# Patient Record
Sex: Female | Born: 1935 | Race: White | Hispanic: No | Smoking: Never smoker
Health system: Southern US, Community
[De-identification: ages and names within clinical notes are randomized; demographics above are authoritative.]

## PROBLEM LIST (undated history)

## (undated) DIAGNOSIS — I071 Rheumatic tricuspid insufficiency: Secondary | ICD-10-CM

## (undated) DIAGNOSIS — J45909 Unspecified asthma, uncomplicated: Secondary | ICD-10-CM

## (undated) DIAGNOSIS — K219 Gastro-esophageal reflux disease without esophagitis: Secondary | ICD-10-CM

## (undated) DIAGNOSIS — I272 Pulmonary hypertension, unspecified: Secondary | ICD-10-CM

## (undated) DIAGNOSIS — I4821 Permanent atrial fibrillation: Secondary | ICD-10-CM

## (undated) DIAGNOSIS — Z8711 Personal history of peptic ulcer disease: Secondary | ICD-10-CM

## (undated) DIAGNOSIS — Z9289 Personal history of other medical treatment: Secondary | ICD-10-CM

## (undated) DIAGNOSIS — Z5189 Encounter for other specified aftercare: Secondary | ICD-10-CM

## (undated) DIAGNOSIS — M3500 Sicca syndrome, unspecified: Secondary | ICD-10-CM

## (undated) DIAGNOSIS — T7840XA Allergy, unspecified, initial encounter: Secondary | ICD-10-CM

## (undated) DIAGNOSIS — M1990 Unspecified osteoarthritis, unspecified site: Secondary | ICD-10-CM

## (undated) DIAGNOSIS — F329 Major depressive disorder, single episode, unspecified: Secondary | ICD-10-CM

## (undated) DIAGNOSIS — H409 Unspecified glaucoma: Secondary | ICD-10-CM

## (undated) DIAGNOSIS — F32A Depression, unspecified: Secondary | ICD-10-CM

## (undated) DIAGNOSIS — Z8719 Personal history of other diseases of the digestive system: Secondary | ICD-10-CM

## (undated) DIAGNOSIS — I10 Essential (primary) hypertension: Secondary | ICD-10-CM

## (undated) HISTORY — DX: Personal history of other medical treatment: Z92.89

## (undated) HISTORY — DX: Major depressive disorder, single episode, unspecified: F32.9

## (undated) HISTORY — DX: Unspecified glaucoma: H40.9

## (undated) HISTORY — PX: TONSILLECTOMY: SUR1361

## (undated) HISTORY — DX: Pulmonary hypertension, unspecified: I27.20

## (undated) HISTORY — DX: Essential (primary) hypertension: I10

## (undated) HISTORY — DX: Personal history of peptic ulcer disease: Z87.11

## (undated) HISTORY — DX: Rheumatic tricuspid insufficiency: I07.1

## (undated) HISTORY — PX: ABDOMINAL HYSTERECTOMY: SHX81

## (undated) HISTORY — DX: Depression, unspecified: F32.A

## (undated) HISTORY — DX: Personal history of other diseases of the digestive system: Z87.19

## (undated) HISTORY — DX: Encounter for other specified aftercare: Z51.89

## (undated) HISTORY — PX: FOREARM SURGERY: SHX651

## (undated) HISTORY — PX: WRIST SURGERY: SHX841

## (undated) HISTORY — DX: Allergy, unspecified, initial encounter: T78.40XA

## (undated) HISTORY — DX: Permanent atrial fibrillation: I48.21

---

## 1998-11-12 LAB — HM DEXA SCAN

## 1999-06-20 LAB — HM COLONOSCOPY

## 2010-03-18 ENCOUNTER — Ambulatory Visit: Payer: Self-pay

## 2011-07-11 ENCOUNTER — Ambulatory Visit: Payer: Self-pay | Admitting: Unknown Physician Specialty

## 2012-06-06 ENCOUNTER — Emergency Department: Payer: Self-pay | Admitting: Emergency Medicine

## 2012-06-06 LAB — URINALYSIS, COMPLETE
Blood: NEGATIVE
Nitrite: NEGATIVE
Ph: 6 (ref 4.5–8.0)
Protein: NEGATIVE
RBC,UR: 1 /HPF (ref 0–5)
Specific Gravity: 1.015 (ref 1.003–1.030)
WBC UR: 2 /HPF (ref 0–5)

## 2012-06-06 LAB — COMPREHENSIVE METABOLIC PANEL
Albumin: 3.3 g/dL — ABNORMAL LOW (ref 3.4–5.0)
Alkaline Phosphatase: 98 U/L (ref 50–136)
BUN: 14 mg/dL (ref 7–18)
Calcium, Total: 9.8 mg/dL (ref 8.5–10.1)
Creatinine: 0.97 mg/dL (ref 0.60–1.30)
EGFR (African American): >60
EGFR (Non-African Amer.): 55 — ABNORMAL LOW
Osmolality: 283 (ref 275–301)
SGOT(AST): 25 U/L (ref 15–37)
SGPT (ALT): 15 U/L (ref 12–78)
Total Protein: 7.1 g/dL (ref 6.4–8.2)

## 2012-06-06 LAB — CBC
HCT: 33.4 % — ABNORMAL LOW (ref 35.0–47.0)
HGB: 10.9 g/dL — ABNORMAL LOW (ref 12.0–16.0)
MCH: 26.8 pg (ref 26.0–34.0)
Platelet: 404 10*3/uL (ref 150–440)
RBC: 4.06 10*6/uL (ref 3.80–5.20)
RDW: 15.8 % — ABNORMAL HIGH (ref 11.5–14.5)

## 2015-03-14 DIAGNOSIS — I481 Persistent atrial fibrillation: Secondary | ICD-10-CM | POA: Diagnosis not present

## 2015-03-21 DIAGNOSIS — M542 Cervicalgia: Secondary | ICD-10-CM | POA: Diagnosis not present

## 2015-03-21 DIAGNOSIS — Z5181 Encounter for therapeutic drug level monitoring: Secondary | ICD-10-CM | POA: Diagnosis not present

## 2015-03-29 ENCOUNTER — Emergency Department: Payer: Medicare Other

## 2015-03-29 ENCOUNTER — Observation Stay
Admission: EM | Admit: 2015-03-29 | Discharge: 2015-03-30 | Disposition: A | Discharge status: Home / Self Care | Payer: Medicare Other | Attending: Internal Medicine | Admitting: Internal Medicine

## 2015-03-29 ENCOUNTER — Encounter: Payer: Self-pay | Admitting: Emergency Medicine

## 2015-03-29 DIAGNOSIS — I482 Chronic atrial fibrillation: Secondary | ICD-10-CM | POA: Insufficient documentation

## 2015-03-29 DIAGNOSIS — R0602 Shortness of breath: Secondary | ICD-10-CM

## 2015-03-29 DIAGNOSIS — K219 Gastro-esophageal reflux disease without esophagitis: Secondary | ICD-10-CM | POA: Insufficient documentation

## 2015-03-29 DIAGNOSIS — I4891 Unspecified atrial fibrillation: Secondary | ICD-10-CM | POA: Diagnosis present

## 2015-03-29 DIAGNOSIS — J45909 Unspecified asthma, uncomplicated: Secondary | ICD-10-CM | POA: Insufficient documentation

## 2015-03-29 DIAGNOSIS — M1991 Primary osteoarthritis, unspecified site: Secondary | ICD-10-CM | POA: Diagnosis not present

## 2015-03-29 DIAGNOSIS — Z885 Allergy status to narcotic agent status: Secondary | ICD-10-CM | POA: Insufficient documentation

## 2015-03-29 DIAGNOSIS — M47812 Spondylosis without myelopathy or radiculopathy, cervical region: Secondary | ICD-10-CM | POA: Diagnosis not present

## 2015-03-29 DIAGNOSIS — K1121 Acute sialoadenitis: Secondary | ICD-10-CM | POA: Diagnosis not present

## 2015-03-29 DIAGNOSIS — I10 Essential (primary) hypertension: Secondary | ICD-10-CM | POA: Insufficient documentation

## 2015-03-29 DIAGNOSIS — R220 Localized swelling, mass and lump, head: Secondary | ICD-10-CM | POA: Diagnosis not present

## 2015-03-29 DIAGNOSIS — Z7901 Long term (current) use of anticoagulants: Secondary | ICD-10-CM | POA: Diagnosis not present

## 2015-03-29 DIAGNOSIS — Z90710 Acquired absence of both cervix and uterus: Secondary | ICD-10-CM | POA: Insufficient documentation

## 2015-03-29 DIAGNOSIS — Z9889 Other specified postprocedural states: Secondary | ICD-10-CM | POA: Insufficient documentation

## 2015-03-29 DIAGNOSIS — K1120 Sialoadenitis, unspecified: Secondary | ICD-10-CM | POA: Diagnosis not present

## 2015-03-29 DIAGNOSIS — G319 Degenerative disease of nervous system, unspecified: Secondary | ICD-10-CM | POA: Insufficient documentation

## 2015-03-29 DIAGNOSIS — Z7951 Long term (current) use of inhaled steroids: Secondary | ICD-10-CM | POA: Insufficient documentation

## 2015-03-29 DIAGNOSIS — Z880 Allergy status to penicillin: Secondary | ICD-10-CM | POA: Insufficient documentation

## 2015-03-29 DIAGNOSIS — M3500 Sicca syndrome, unspecified: Secondary | ICD-10-CM | POA: Insufficient documentation

## 2015-03-29 HISTORY — DX: Unspecified asthma, uncomplicated: J45.909

## 2015-03-29 HISTORY — DX: Unspecified osteoarthritis, unspecified site: M19.90

## 2015-03-29 HISTORY — DX: Sjogren syndrome, unspecified: M35.00

## 2015-03-29 HISTORY — DX: Gastro-esophageal reflux disease without esophagitis: K21.9

## 2015-03-29 LAB — CBC
HEMATOCRIT: 41.8 % (ref 35.0–47.0)
Hemoglobin: 13.5 g/dL (ref 12.0–16.0)
MCH: 31.2 pg (ref 26.0–34.0)
MCHC: 32.2 g/dL (ref 32.0–36.0)
MCV: 96.8 fL (ref 80.0–100.0)
Platelets: 185 10*3/uL (ref 150–440)
RBC: 4.32 MIL/uL (ref 3.80–5.20)
RDW: 13.5 % (ref 11.5–14.5)
WBC: 7.6 10*3/uL (ref 3.6–11.0)

## 2015-03-29 LAB — BASIC METABOLIC PANEL
Anion gap: 6 (ref 5–15)
BUN: 15 mg/dL (ref 6–20)
CHLORIDE: 106 mmol/L (ref 101–111)
CO2: 22 mmol/L (ref 22–32)
Calcium: 9.8 mg/dL (ref 8.9–10.3)
Creatinine, Ser: 0.77 mg/dL (ref 0.44–1.00)
GFR calc Af Amer: >60 mL/min (ref >60)
GFR calc non Af Amer: >60 mL/min (ref >60)
GLUCOSE: 93 mg/dL (ref 65–99)
POTASSIUM: 4.9 mmol/L (ref 3.5–5.1)
Sodium: 134 mmol/L — ABNORMAL LOW (ref 135–145)

## 2015-03-29 LAB — TROPONIN I: TROPONIN I: 0.04 ng/mL — AB (ref <0.031)

## 2015-03-29 LAB — PROTIME-INR
INR: 1.21
Prothrombin Time: 15.5 seconds — ABNORMAL HIGH (ref 11.4–15.0)

## 2015-03-29 MED ORDER — PREDNISONE 50 MG PO TABS
60.0000 mg | ORAL_TABLET | Freq: Every day | ORAL | Status: DC
  Administered 2015-03-30: 60 mg via ORAL
  Filled 2015-03-29: qty 1

## 2015-03-29 MED ORDER — HYDROCODONE-ACETAMINOPHEN 7.5-325 MG PO TABS
ORAL_TABLET | ORAL | Status: AC
Start: 2015-03-29 — End: 2015-03-30
  Filled 2015-03-29: qty 1

## 2015-03-29 MED ORDER — ESCITALOPRAM OXALATE 10 MG PO TABS
40.0000 mg | ORAL_TABLET | Freq: Every day | ORAL | Status: DC
  Administered 2015-03-30: 40 mg via ORAL
  Filled 2015-03-29: qty 4

## 2015-03-29 MED ORDER — PREDNISONE 20 MG PO TABS
60.0000 mg | ORAL_TABLET | Freq: Once | ORAL | Status: AC
  Administered 2015-03-29: 60 mg via ORAL
  Filled 2015-03-29: qty 3

## 2015-03-29 MED ORDER — APIXABAN 2.5 MG PO TABS
2.5000 mg | ORAL_TABLET | Freq: Two times a day (BID) | ORAL | Status: DC
  Administered 2015-03-30: 2.5 mg via ORAL
  Filled 2015-03-29: qty 1

## 2015-03-29 MED ORDER — BRIMONIDINE TARTRATE 0.15 % OP SOLN
1.0000 [drp] | Freq: Two times a day (BID) | OPHTHALMIC | Status: DC
  Administered 2015-03-30 (×2): 1 [drp] via OPHTHALMIC
  Filled 2015-03-29: qty 5

## 2015-03-29 MED ORDER — SODIUM CHLORIDE 0.9 % IV SOLN
INTRAVENOUS | Status: DC
  Administered 2015-03-29: via INTRAVENOUS

## 2015-03-29 MED ORDER — SODIUM CHLORIDE 0.9% FLUSH
3.0000 mL | Freq: Two times a day (BID) | INTRAVENOUS | Status: DC
  Administered 2015-03-30: 3 mL via INTRAVENOUS

## 2015-03-29 MED ORDER — ACETAMINOPHEN 325 MG PO TABS: 650.0000 mg | ORAL_TABLET | Freq: Four times a day (QID) | ORAL | Status: DC | PRN

## 2015-03-29 MED ORDER — METOPROLOL TARTRATE 25 MG PO TABS
12.5000 mg | ORAL_TABLET | Freq: Once | ORAL | Status: AC
  Administered 2015-03-29: 12.5 mg via ORAL
  Filled 2015-03-29: qty 1

## 2015-03-29 MED ORDER — ASPIRIN 81 MG PO CHEW
324.0000 mg | CHEWABLE_TABLET | Freq: Once | ORAL | Status: AC
  Administered 2015-03-29: 324 mg via ORAL
  Filled 2015-03-29: qty 4

## 2015-03-29 MED ORDER — PANTOPRAZOLE SODIUM 40 MG PO TBEC
40.0000 mg | DELAYED_RELEASE_TABLET | Freq: Every day | ORAL | Status: DC
  Administered 2015-03-30: 40 mg via ORAL
  Filled 2015-03-29: qty 1

## 2015-03-29 MED ORDER — AMLODIPINE BESYLATE 5 MG PO TABS
5.0000 mg | ORAL_TABLET | Freq: Every day | ORAL | Status: DC
  Administered 2015-03-30: 5 mg via ORAL
  Filled 2015-03-29: qty 1

## 2015-03-29 MED ORDER — MELATONIN 3 MG PO TABS: 3.0000 mg | ORAL_TABLET | Freq: Every day | ORAL | Status: DC

## 2015-03-29 MED ORDER — CLINDAMYCIN PHOSPHATE 600 MG/50ML IV SOLN
600.0000 mg | Freq: Four times a day (QID) | INTRAVENOUS | Status: DC
  Administered 2015-03-30: 600 mg via INTRAVENOUS
  Filled 2015-03-29 (×4): qty 50

## 2015-03-29 MED ORDER — METOPROLOL TARTRATE 50 MG PO TABS
50.0000 mg | ORAL_TABLET | Freq: Two times a day (BID) | ORAL | Status: DC
  Administered 2015-03-30: 50 mg via ORAL
  Filled 2015-03-29: qty 1

## 2015-03-29 MED ORDER — HYDROCODONE-ACETAMINOPHEN 7.5-325 MG PO TABS
1.0000 | ORAL_TABLET | Freq: Three times a day (TID) | ORAL | Status: DC | PRN
  Administered 2015-03-29: 1 via ORAL

## 2015-03-29 MED ORDER — ONDANSETRON HCL 4 MG/2ML IJ SOLN
4.0000 mg | Freq: Four times a day (QID) | INTRAMUSCULAR | Status: DC | PRN
Start: 2015-03-29 — End: 2015-03-30

## 2015-03-29 MED ORDER — CLINDAMYCIN PHOSPHATE 600 MG/50ML IV SOLN
600.0000 mg | Freq: Once | INTRAVENOUS | Status: AC
  Administered 2015-03-29: 600 mg via INTRAVENOUS
  Filled 2015-03-29: qty 50

## 2015-03-29 MED ORDER — ACETAMINOPHEN 650 MG RE SUPP: 650.0000 mg | Freq: Four times a day (QID) | RECTAL | Status: DC | PRN

## 2015-03-29 MED ORDER — BUDESONIDE-FORMOTEROL FUMARATE 160-4.5 MCG/ACT IN AERO
2.0000 | INHALATION_SPRAY | Freq: Two times a day (BID) | RESPIRATORY_TRACT | Status: DC
  Administered 2015-03-30: 2 via RESPIRATORY_TRACT
  Filled 2015-03-29: qty 6

## 2015-03-29 MED ORDER — ONDANSETRON HCL 4 MG PO TABS: 4.0000 mg | ORAL_TABLET | Freq: Four times a day (QID) | ORAL | Status: DC | PRN

## 2015-03-29 NOTE — ED Notes (Signed)
Pt from subwait to room 9. Pt went to urgent care with c/o swollen glands in her neck. While there c/o sob so they took an ekg and it showed her afib. Pt dx with afib and is on eliquis. Labs and iv previously obtained in triage. Pt on monitor

## 2015-03-29 NOTE — ED Notes (Signed)
Dr. Gayle at bedside  

## 2015-03-29 NOTE — H&P (Signed)
[facility] [place] NAME: Peggy Carter    MR#:  [id number]  DATE OF BIRTH:  1929/09/12   DATE OF ADMISSION:  03/29/2015  PRIMARY CARE PHYSICIAN: Morton Peters, MD   REQUESTING/REFERRING PHYSICIAN: Edd Fabian  CHIEF COMPLAINT:   Chief Complaint  Patient presents with  . Lymphadenopathy  . Shortness of Breath    HISTORY OF PRESENT ILLNESS:  Peggy Carter  is a 82 y.o. female with a known history of Sjogren's, atrial fib on anticoagulation who is presenting with left facial swelling. She states approximate 2 day duration of left facial swelling. Has associated pain with touch, pressure described only as "pain" intensity 5/10 worse with touching no relieving factors nonradiating. This has not affected her appetite, ability to swallow. On arrival to emergency department she complained shortness of breath noted to be in atrial fibrillation rapid ventricular response which has improved. Case discussed with ENT about parotiditis findings who recommended steroids and antibiotics  PAST MEDICAL HISTORY:   Past Medical History  Diagnosis Date  . A-fib ([place])   . Arthritis   . Asthma   . Sjoegren syndrome ([place])   . Acid reflux     PAST SURGICAL HISTORY:   Past Surgical History  Procedure Laterality Date  . Arm surgery    . Abdominal hysterectomy      SOCIAL HISTORY:   Social History  Substance Use Topics  . Smoking status: Never Smoker   . Smokeless tobacco: Not on file  . Alcohol Use: No    FAMILY HISTORY:   Family History  Problem Relation Age of Onset  . Diabetes Neg Hx     DRUG ALLERGIES:   Allergies  Allergen Reactions  . Penicillins Anaphylaxis and Hives    States "almost died" Has patient had a PCN reaction causing immediate rash, facial/tongue/throat swelling, SOB or lightheadedness with hypotension: Yes Has patient had a PCN reaction causing severe rash involving mucus membranes or skin  necrosis: No Has patient had a PCN reaction that required hospitalization No Has patient had a PCN reaction occurring within the last 10 years: No If all of the above answers are "NO", then may proceed with Cephalosporin use.  . Codeine Hives    REVIEW OF SYSTEMS:  REVIEW OF SYSTEMS:  CONSTITUTIONAL: Denies fevers, chills, fatigue, weakness.  EYES: Denies blurred vision, double vision, or eye pain.  EARS, NOSE, THROAT: Denies tinnitus, ear pain, hearing loss.  RESPIRATORY: denies cough, shortness of breath, wheezing  CARDIOVASCULAR: Denies chest pain, palpitations, edema.  GASTROINTESTINAL: Denies nausea, vomiting, diarrhea, abdominal pain.  GENITOURINARY: Denies dysuria, hematuria.  ENDOCRINE: Denies nocturia or thyroid problems. HEMATOLOGIC AND LYMPHATIC: Denies easy bruising or bleeding.  SKIN: Denies rash or lesions.  MUSCULOSKELETAL: Denies pain in neck, back, shoulder, knees, hips, or further arthritic symptoms.  NEUROLOGIC: Denies paralysis, paresthesias.  PSYCHIATRIC: Denies anxiety or depressive symptoms. Otherwise full review of systems performed by me is negative.   MEDICATIONS AT HOME:   Prior to Admission medications   Medication Sig Start Date End Date Taking? Authorizing Provider  amLODipine (NORVASC) 5 MG tablet Take 5 mg by mouth daily.   Yes Historical Provider, MD  apixaban (ELIQUIS) 2.5 MG TABS tablet Take 2.5 mg by mouth 2 (two) times daily.   Yes Historical Provider, MD  brimonidine (ALPHAGAN P) 0.1 % SOLN Apply 1 drop to eye 2 (two) times daily.   Yes Historical Provider, MD  budesonide-formoterol (SYMBICORT) 160-4.5 MCG/ACT inhaler Inhale  2 puffs into the lungs 2 (two) times daily.   Yes Historical Provider, MD  escitalopram (LEXAPRO) 20 MG tablet Take 40 mg by mouth daily.   Yes Historical Provider, MD  HYDROcodone-acetaminophen (NORCO) 7.5-325 MG tablet Take 1 tablet by mouth 3 (three) times daily as needed for moderate pain.   Yes Historical Provider, MD   Melatonin 3 MG TABS Take 3 mg by mouth at bedtime.   Yes Historical Provider, MD  metoprolol (LOPRESSOR) 50 MG tablet Take 50 mg by mouth 2 (two) times daily.   Yes Historical Provider, MD  omeprazole (PRILOSEC) 20 MG capsule Take 20 mg by mouth 2 (two) times daily before a meal.   Yes Historical Provider, MD      VITAL SIGNS:  Blood pressure 125/72, pulse 100, temperature 97.9 F (36.6 C), temperature source Oral, resp. rate 17, height 4\' 11"  (1.499 m), weight 106 lb (48.081 kg), SpO2 95 %.  PHYSICAL EXAMINATION:  VITAL SIGNS: Filed Vitals:   03/29/15 2214 03/29/15 2230  BP: 131/109 125/72  Pulse: 112 100  Temp:    Resp:  22   GENERAL:82 y.o.female currently in no acute distress.  HEAD: Normocephalic, atraumatic. Marked swelling angle of left mandible without erythema EYES: Pupils equal, round, reactive to light. Extraocular muscles intact. No scleral icterus.  MOUTH: Moist mucosal membrane. Dentition intact. No abscess noted.  EAR, NOSE, THROAT: Clear without exudates. No external lesions.  NECK: Supple. No thyromegaly. No nodules. No JVD.  PULMONARY: Clear to ascultation, without wheeze rails or rhonci. No use of accessory muscles, Good respiratory effort. good air entry bilaterally CHEST: Nontender to palpation.  CARDIOVASCULAR: S1 and S2. irRegular rate and rhythm. No murmurs, rubs, or gallops. No edema. Pedal pulses 2+ bilaterally.  GASTROINTESTINAL: Soft, nontender, nondistended. No masses. Positive bowel sounds. No hepatosplenomegaly.  MUSCULOSKELETAL: No swelling, clubbing, or edema. Range of motion full in all extremities.  NEUROLOGIC: Cranial nerves II through XII are intact. No gross focal neurological deficits. Sensation intact. Reflexes intact.  SKIN: No ulceration, lesions, rashes, or cyanosis. Skin warm and dry. Turgor intact.  PSYCHIATRIC: Mood, affect within normal limits. The patient is awake, alert and oriented x 3. Insight, judgment intact.    LABORATORY  PANEL:   CBC  Recent Labs Lab 03/29/15 1844  WBC 7.6  HGB 13.5  HCT 41.8  PLT 185   ------------------------------------------------------------------------------------------------------------------  Chemistries   Recent Labs Lab 03/29/15 1844  NA 134*  K 4.9  CL 106  CO2 22  GLUCOSE 93  BUN 15  CREATININE 0.77  CALCIUM 9.8   ------------------------------------------------------------------------------------------------------------------  Cardiac Enzymes  Recent Labs Lab 03/29/15 1844  TROPONINI 0.04*   ------------------------------------------------------------------------------------------------------------------  RADIOLOGY:  Dg Chest 2 View  03/29/2015  CLINICAL DATA:  Shortness of breath today.  Initial encounter. EXAM: CHEST  2 VIEW COMPARISON:  Single view of the chest 06/06/2012. PA and lateral chest 03/18/2010. FINDINGS: There is mild cardiomegaly without edema. No consolidative process, pneumothorax or effusion. No focal bony abnormality. IMPRESSION: No acute disease. Electronically Signed   By: Inge Rise M.D.   On: 03/29/2015 19:11   Ct Maxillofacial Wo Cm  03/29/2015  CLINICAL DATA:  Pre articular swelling bilaterally for 3 days, worse on the left. Initial encounter. EXAM: CT MAXILLOFACIAL WITHOUT CONTRAST TECHNIQUE: Multidetector CT imaging of the maxillofacial structures was performed. Multiplanar CT image reconstructions were also generated. A small metallic BB was placed on the right temple in order to reliably differentiate right from left. COMPARISON:  None. FINDINGS: A  marker is placed in the region of concern and overlies the left parotid gland. No underlying fluid collection or mass identified. The left parotid gland is more prominent than the right. No ductal dilatation or stone is identified. The submandibular glands are atrophic. No lymphadenopathy or mass is seen. The patient is status post bilateral lens extraction. Imaged intracranial  contents demonstrate cortical atrophy. Imaged paranasal sinuses are clear. The patient has marked multilevel cervical spondylosis. No lytic or sclerotic bony lesion is identified. IMPRESSION: Although both parotid glands demonstrate fatty atrophy, the left parotid is more prominent than the right possibly due to infectious or inflammatory process (parotiditis). No stone or mass is identified. Cervical spondylosis. Electronically Signed   By: Inge Rise M.D.   On: 03/29/2015 22:18    EKG:   Orders placed or performed during the hospital encounter of 03/29/15  . EKG 12-Lead  . EKG 12-Lead  . ED EKG within 10 minutes  . ED EKG within 10 minutes    IMPRESSION AND PLAN:   82 year old Caucasian female history of Sjogren's presenting with facial swelling  1. Parotiditis: Case discussed with ENT in emergency department, antibiotics, steroids 2. Atrial fibrillation rapid ventricular response: Has improved, continue IV fluid hydration, Cardizem as required, heart rate less than 120 continue anticoagulation, trend troponins 3. Hypertension essential: Norvasc , metoprolol 4. GERD without esophagitis: PPI therapy 5. Venous thrombus embolism prophylactic: Therapeutic anticoagulation    All the records are reviewed and case discussed with ED provider. Management plans discussed with the patient, family and they are in agreement.  CODE STATUS: Full  TOTAL TIME TAKING CARE OF THIS PATIENT: 35 minutes.    Kento Gossman,  Karenann Cai.D on 03/29/2015 at 11:48 PM  Between 7am to 6pm - Pager - [phone]  After 6pm: House Pager: - [facility]  Office  [phone]  CC: Primary care physician; Morton Peters, MD

## 2015-03-29 NOTE — ED Notes (Signed)
Pt is on blood thinner, has a hx of afib.

## 2015-03-29 NOTE — ED Notes (Signed)
Pt states her glands on both sides of her neck became swollen 3 days ago. Son states she became sob on the way here, she used her respiratory inhaler, pt appears in no distress, talking and laughing.

## 2015-03-29 NOTE — ED Provider Notes (Signed)
[facility] Emergency Department Provider Note  ____________________________________________  Time seen: Approximately 8:10 PM  I have reviewed the triage vital signs and the nursing notes.   HISTORY  Chief Complaint Lymphadenopathy and Shortness of Breath    HPI Arianny Prisbrey is a 82 y.o. female with history of A. fib on elliquis, Sjogren syndrome, emphysema who presents for evaluation of shortness of breath and left-sided preauricular swelling, gradual onset today, intermittent, currently mild. The patient reports that she awoke with some swelling just in front of her left ear this morning. She reports that in the past she had a salivary gland infection that was treated with Cipro that presented similarly. She went to urgent care today and her EKG showed atrial fibrillation with an increased heart rate and because she was complaining of some mild shortness of breath, she was sent here for further evaluation. At this time though she reports that she has no shortness of breath. She has had no fevers. She has no chest pain. She has no abdominal pain, no vomiting, no diarrhea.   Past Medical History  Diagnosis Date  . A-fib ([place])   . Arthritis   . Asthma   . Sjoegren syndrome ([place])   . Acid reflux     There are no active problems to display for this patient.   Past Surgical History  Procedure Laterality Date  . Arm surgery    . Abdominal hysterectomy      Current Outpatient Rx  Name  Route  Sig  Dispense  Refill  . amLODipine (NORVASC) 5 MG tablet   Oral   Take 5 mg by mouth daily.         Marland Kitchen apixaban (ELIQUIS) 2.5 MG TABS tablet   Oral   Take 2.5 mg by mouth 2 (two) times daily.         . brimonidine (ALPHAGAN P) 0.1 % SOLN   Ophthalmic   Apply 1 drop to eye 2 (two) times daily.         . budesonide-formoterol (SYMBICORT) 160-4.5 MCG/ACT inhaler   Inhalation   Inhale 2 puffs into the lungs 2 (two) times daily.         Marland Kitchen  escitalopram (LEXAPRO) 20 MG tablet   Oral   Take 40 mg by mouth daily.         Marland Kitchen HYDROcodone-acetaminophen (NORCO) 7.5-325 MG tablet   Oral   Take 1 tablet by mouth 3 (three) times daily as needed for moderate pain.         . Melatonin 3 MG TABS   Oral   Take 3 mg by mouth at bedtime.         . metoprolol (LOPRESSOR) 50 MG tablet   Oral   Take 50 mg by mouth 2 (two) times daily.         Marland Kitchen omeprazole (PRILOSEC) 20 MG capsule   Oral   Take 20 mg by mouth 2 (two) times daily before a meal.           Allergies Penicillins and Codeine  No family history on file.  Social History Social History  Substance Use Topics  . Smoking status: Never Smoker   . Smokeless tobacco: None  . Alcohol Use: No    Review of Systems Constitutional: No fever/chills Eyes: No visual changes. ENT: No sore throat. Cardiovascular: Denies chest pain. Respiratory: +shortness of breath. Gastrointestinal: No abdominal pain.  No nausea, no vomiting.  No diarrhea.  No constipation. Genitourinary:  Negative for dysuria. Musculoskeletal: Negative for back pain. Skin: Negative for rash. Neurological: Negative for headaches, focal weakness or numbness.  10-point ROS otherwise negative.  ____________________________________________   PHYSICAL EXAM:  VITAL SIGNS: ED Triage Vitals  Enc Vitals Group     BP 03/29/15 1832 113/77 mmHg     Pulse Rate 03/29/15 1832 89     Resp 03/29/15 1832 18     Temp 03/29/15 1832 97.9 F (36.6 C)     Temp Source 03/29/15 1832 Oral     SpO2 03/29/15 1832 97 %     Weight 03/29/15 1832 106 lb (48.081 kg)     Height 03/29/15 1832 4\' 11"  (1.499 m)     Head Cir --      Peak Flow --      Pain Score 03/29/15 1835 8     Pain Loc --      Pain Edu? --      Excl. in [place]? --     Constitutional: Alert and oriented. Well appearing and in no acute distress. Eyes: Conjunctivae are normal. PERRL. EOMI. Head: Atraumatic. 1 cm nontender preauricular swelling on the  left without surrounding erythema, no fluctuance. Nose: No congestion/rhinnorhea. Mouth/Throat: Mucous membranes are moist.  Oropharynx non-erythematous. Neck: No stridor.   Cardiovascular: tachycardic rate, irregular rhythm. Grossly normal heart sounds.  Good peripheral circulation. Respiratory: Normal respiratory effort.  No retractions. Lungs CTAB. Gastrointestinal: Soft and nontender. No distention. No CVA tenderness. Genitourinary: deferred Musculoskeletal: No lower extremity tenderness nor edema.  No joint effusions. Neurologic:  Normal speech and language. No gross focal neurologic deficits are appreciated.  Skin:  Skin is warm, dry and intact. No rash noted. Psychiatric: Mood and affect are normal. Speech and behavior are normal.  ____________________________________________   LABS (all labs ordered are listed, but only abnormal results are displayed)  Labs Reviewed  BASIC METABOLIC PANEL - Abnormal; Notable for the following:    Sodium 134 (*)    All other components within normal limits  PROTIME-INR - Abnormal; Notable for the following:    Prothrombin Time 15.5 (*)    All other components within normal limits  TROPONIN I - Abnormal; Notable for the following:    Troponin I 0.04 (*)    All other components within normal limits  CULTURE, BLOOD (ROUTINE X 2)  CULTURE, BLOOD (ROUTINE X 2)  CBC   ____________________________________________  EKG  ED ECG REPORT I, Joanne Gavel, the attending physician, personally viewed and interpreted this ECG.   Date: 03/29/2015  EKG Time: 18:38  Rate: 105  Rhythm: atrial fibrillation, rate 105  Axis: normal  Intervals:none  ST&T Change: No acute ST elevation.  ____________________________________________  RADIOLOGY  CXR  IMPRESSION: No acute disease.  CT face IMPRESSION: Although both parotid glands demonstrate fatty atrophy, the left parotid is more prominent than the right possibly due to infectious or  inflammatory process (parotiditis). No stone or mass is identified.  Cervical spondylosis.  ____________________________________________   PROCEDURES  Procedure(s) performed: None  Critical Care performed: No  ____________________________________________   INITIAL IMPRESSION / ASSESSMENT AND PLAN / ED COURSE  Pertinent labs & imaging results that were available during my care of the patient were reviewed by me and considered in my medical decision making (see chart for details).  Lonny Widdowson is a 82 y.o. female with history of A. fib on elliquis, Sjogren syndrome, emphysema who presents for evaluation of shortness of breath and left-sided preauricular swelling, gradual onset today, intermittent, currently mild.  On exam, she is well-appearing and in no acute distress. She is tachycardic with a heart rate of 115 bpm. EKG shows atrial fibrillation. The remainder of her vital signs are stable and she is afebrile. Troponin is elevated at 0.04, suspect possibly demand ischemia related to atrial fibrillation with elevated rate though NSTEMI is on the differential given her shortness of breath today which could represent atypical ACS. Heart rate is 97 bpm at this time after small dose of by mouth metoprolol. CT scan shows possible parotiditis. I discussed the case with Dr. Pryor Ochoa of ENT who recommends steroids and antibiotics. We'll give IV clindamycin. Blood cultures ordered. Case discussed with the hospitalist, Dr. Lavetta Nielsen, for admission at 63 PM. ____________________________________________   FINAL CLINICAL IMPRESSION(S) / ED DIAGNOSES  Final diagnoses:  SOB (shortness of breath)  Atrial fibrillation with rapid ventricular response ([place])  Parotiditis      Joanne Gavel, MD 03/29/15 2304

## 2015-03-29 NOTE — ED Notes (Signed)
Dr. Hower at bedside.  

## 2015-03-30 DIAGNOSIS — I10 Essential (primary) hypertension: Secondary | ICD-10-CM | POA: Diagnosis not present

## 2015-03-30 DIAGNOSIS — K1121 Acute sialoadenitis: Secondary | ICD-10-CM | POA: Diagnosis not present

## 2015-03-30 DIAGNOSIS — K1120 Sialoadenitis, unspecified: Secondary | ICD-10-CM | POA: Diagnosis not present

## 2015-03-30 DIAGNOSIS — K219 Gastro-esophageal reflux disease without esophagitis: Secondary | ICD-10-CM | POA: Diagnosis not present

## 2015-03-30 DIAGNOSIS — I4891 Unspecified atrial fibrillation: Secondary | ICD-10-CM | POA: Diagnosis not present

## 2015-03-30 LAB — TROPONIN I
Troponin I: <0.03 ng/mL (ref <0.031)
Troponin I: <0.03 ng/mL (ref <0.031)

## 2015-03-30 LAB — TSH: TSH: 0.962 u[IU]/mL (ref 0.350–4.500)

## 2015-03-30 MED ORDER — CLINDAMYCIN HCL 300 MG PO CAPS: 300.0000 mg | ORAL_CAPSULE | Freq: Three times a day (TID) | ORAL | Status: DC

## 2015-03-30 MED ORDER — PREDNISONE 10 MG PO TABS: 60.0000 mg | ORAL_TABLET | Freq: Every day | ORAL | Status: DC

## 2015-03-30 MED ORDER — CLINDAMYCIN HCL 150 MG PO CAPS
300.0000 mg | ORAL_CAPSULE | Freq: Three times a day (TID) | ORAL | Status: DC
  Filled 2015-03-30 (×3): qty 1

## 2015-03-30 MED ORDER — PNEUMOCOCCAL VAC POLYVALENT 25 MCG/0.5ML IJ INJ: 0.5000 mL | INJECTION | INTRAMUSCULAR | Status: DC

## 2015-03-30 NOTE — Care Management Note (Signed)
Case Management Note  Patient Details  Name: Peggy Carter MRN: [id number] Date of Birth: June 04, 1929  Subjective/Objective:    Case discussed with Dr. Posey Pronto. Discharge planned for today. No needs identified. Case closed               Action/Plan:   Expected Discharge Date:                  Expected Discharge Plan:  Home/Self Care  In-House Referral:     Discharge planning Services  CM Consult  Post Acute Care Choice:    Choice offered to:     DME Arranged:    DME Agency:     HH Arranged:    HH Agency:     Status of Service:  In process, will continue to follow  Medicare Important Message Given:    Date Medicare IM Given:    Medicare IM give by:    Date Additional Medicare IM Given:    Additional Medicare Important Message give by:     If discussed at [place] of Stay Meetings, dates discussed:    Additional Comments:  Jolly Mango, RN 03/30/2015, 9:44 AM

## 2015-03-30 NOTE — Care Management Note (Signed)
Case Management Note  Patient Details  Name: Peggy Carter MRN: [id number] Date of Birth: September 15, 1929  Subjective/Objective:   RNCM assessment for discharge planning. Met with patient and she ask that I speak with her daughter in law, Nannette Vicencio. TC to daughter in law. Patient lives at home with her son and this daughter in law. She uses a cane and a walker. Get her medications at [facility] in [place]. Patients PCP was Dr. Hardin Negus and she was last seen in approximately May 2016. Nannette states that no one has been answering at the MD office so they are not sure if he has retired. She is going to get patient established with a new PCP at [facility]. Denies issues obtaining medications, copays  or transporation.                 Action/Plan: Following progression. No needs anticipated at this time  Expected Discharge Date:                  Expected Discharge Plan:     In-House Referral:     Discharge planning Services  CM Consult  Post Acute Care Choice:    Choice offered to:     DME Arranged:    DME Agency:     HH Arranged:    [place] Agency:     Status of Service:  In process, will continue to follow  Medicare Important Message Given:    Date Medicare IM Given:    Medicare IM give by:    Date Additional Medicare IM Given:    Additional Medicare Important Message give by:     If discussed at [place] of Stay Meetings, dates discussed:    Additional Comments:  Jolly Mango, RN 03/30/2015, 9:21 AM

## 2015-03-30 NOTE — Care Management Obs Status (Signed)
[place] NOTIFICATION   Patient Details  Name: Peggy Carter MRN: [id number] Date of Birth: 11-09-1929   Medicare Observation Status Notification Given:  Yes    Jolly Mango, RN 03/30/2015, 9:14 AM

## 2015-03-30 NOTE — Progress Notes (Signed)
Discharge: Pt d/c from room via wheelchair, Family member with the pt. Discharge instructions given to the patient and family members.  No questions from pt, reintegrated to the pt to call or go to the ED for chest discomfort. Pt dressed in street clothes and left with discharge papers and prescriptionswas called to the pharmacy. IV d/ced, tele removed and no complaints of pain or discomfort.

## 2015-03-30 NOTE — Progress Notes (Signed)
PHARMACIST - PHYSICIAN ORDER COMMUNICATION  CONCERNING: P&T Medication Policy on Herbal Medications  DESCRIPTION:  This patient's order for:  melatonin  has been noted.  This product(s) is classified as an "herbal" or natural product. Due to a lack of definitive safety studies or FDA approval, nonstandard manufacturing practices, plus the potential risk of unknown drug-drug interactions while on inpatient medications, the Pharmacy and Therapeutics Committee does not permit the use of "herbal" or natural products of this type within [place].   ACTION TAKEN: The pharmacy department is unable to verify this order at this time. Please reevaluate patient's clinical condition at discharge and address if the herbal or natural product(s) should be resumed at that time.   

## 2015-03-30 NOTE — Progress Notes (Signed)
Pt alert and oriented x4, no complaints of pain or discomfort.  Bed in low position, call bell within reach.  Bed alarms on and functioning.  Assessment done and charted.  Will continue to monitor and do hourly rounding throughout the shift 

## 2015-03-30 NOTE — Discharge Summary (Signed)
[facility] NAME: Peggy Carter    MR#:  [id number]  DATE OF BIRTH:  1929-08-16  DATE OF ADMISSION:  03/29/2015 ADMITTING PHYSICIAN: Lytle Butte, MD  DATE OF DISCHARGE: 03/30/15  PRIMARY CARE PHYSICIAN: Morton Peters, MD    ADMISSION DIAGNOSIS:  Parotiditis [K11.20] SOB (shortness of breath) [R06.02] Atrial fibrillation with rapid ventricular response (HCC) [I48.91]  DISCHARGE DIAGNOSIS:  Acute  Parotitis-left side Acute on chronic afib  Chronic anticoagulation  SECONDARY DIAGNOSIS:   Past Medical History  Diagnosis Date  . A-fib ([place])   . Arthritis   . Asthma   . Sjoegren syndrome ([place])   . Acid reflux     HOSPITAL COURSE:   82 year old Caucasian female history of Sjogren's presenting with facial swelling  1. Acute left Parotiditis: Case discussed with ENT in emergency department, antibiotics, steroids 2. Atrial fibrillation rapid ventricular response: Has improved, continue IV fluid hydration, Cardizem as required, heart rate less than 120 continue anticoagulation -cont metoprolol and eliquis -HR 90's 3. Hypertension essential: Norvasc , metoprolol 4. GERD without esophagitis: PPI therapy 5. Venous thrombus embolism prophylactic: Therapeutic anticoagulation  Doing overall well  Spoke with pt's son. No needs at home D/c home with outpt ENT follow  CONSULTS OBTAINED:  Treatment Team:  Lytle Butte, MD  DRUG ALLERGIES:   Allergies  Allergen Reactions  . Penicillins Anaphylaxis and Hives    States "almost died" Has patient had a PCN reaction causing immediate rash, facial/tongue/throat swelling, SOB or lightheadedness with hypotension: Yes Has patient had a PCN reaction causing severe rash involving mucus membranes or skin necrosis: No Has patient had a PCN reaction that required hospitalization No Has patient had a PCN reaction occurring within the last 10 years: No If all of the  above answers are "NO", then may proceed with Cephalosporin use.  . Codeine Hives    DISCHARGE MEDICATIONS:   Current Discharge Medication List    START taking these medications   Details  clindamycin (CLEOCIN) 300 MG capsule Take 1 capsule (300 mg total) by mouth every 8 (eight) hours. Qty: 21 capsule, Refills: 0    predniSONE (DELTASONE) 10 MG tablet Take 6 tablets (60 mg total) by mouth daily with breakfast. Qty: 21 tablet, Refills: 0      CONTINUE these medications which have NOT CHANGED   Details  amLODipine (NORVASC) 5 MG tablet Take 5 mg by mouth daily.    apixaban (ELIQUIS) 2.5 MG TABS tablet Take 2.5 mg by mouth 2 (two) times daily.    brimonidine (ALPHAGAN P) 0.1 % SOLN Apply 1 drop to eye 2 (two) times daily.    budesonide-formoterol (SYMBICORT) 160-4.5 MCG/ACT inhaler Inhale 2 puffs into the lungs 2 (two) times daily.    escitalopram (LEXAPRO) 20 MG tablet Take 40 mg by mouth daily.    HYDROcodone-acetaminophen (NORCO) 7.5-325 MG tablet Take 1 tablet by mouth 3 (three) times daily as needed for moderate pain.    Melatonin 3 MG TABS Take 3 mg by mouth at bedtime.    metoprolol (LOPRESSOR) 50 MG tablet Take 50 mg by mouth 2 (two) times daily.    omeprazole (PRILOSEC) 20 MG capsule Take 20 mg by mouth 2 (two) times daily before a meal.        If you experience worsening of your admission symptoms, develop shortness of breath, life threatening emergency, suicidal or homicidal thoughts you must seek medical attention immediately by calling 911 or  calling your MD immediately  if symptoms less severe.  You Must read complete instructions/literature along with all the possible adverse reactions/side effects for all the Medicines you take and that have been prescribed to you. Take any new Medicines after you have completely understood and accept all the possible adverse reactions/side effects.   Please note  You were cared for by a hospitalist during your hospital  stay. If you have any questions about your discharge medications or the care you received while you were in the hospital after you are discharged, you can call the unit and asked to speak with the hospitalist on call if the hospitalist that took care of you is not available. Once you are discharged, your primary care physician will handle any further medical issues. Please note that NO REFILLS for any discharge medications will be authorized once you are discharged, as it is imperative that you return to your primary care physician (or establish a relationship with a primary care physician if you do not have one) for your aftercare needs so that they can reassess your need for medications and monitor your lab values. Today   SUBJECTIVE   Doing well. Able to eat well w/o much left cheek pain  VITAL SIGNS:  Blood pressure 100/61, pulse 101, temperature 98 F (36.7 C), temperature source Oral, resp. rate 22, height 4\' 11"  (1.499 m), weight 45.224 kg (99 lb 11.2 oz), SpO2 97 %.  I/O:   Intake/Output Summary (Last 24 hours) at 03/30/15 1148 Last data filed at 03/30/15 0830  Gross per 24 hour  Intake      0 ml  Output      0 ml  Net      0 ml    PHYSICAL EXAMINATION:  GENERAL:  82 y.o.-year-old patient lying in the bed with no acute distress.  EYES: Pupils equal, round, reactive to light and accommodation. No scleral icterus. Extraocular muscles intact.  HEENT: Head atraumatic, normocephalic. Oropharynx and nasopharynx clear.  NECK:  Supple, no jugular venous distention. No thyroid enlargement, no tenderness.  LUNGS: Normal breath sounds bilaterally, no wheezing, rales,rhonchi or crepitation. No use of accessory muscles of respiration.  CARDIOVASCULAR: S1, S2 normal. No murmurs, rubs, or gallops.  ABDOMEN: Soft, non-tender, non-distended. Bowel sounds present. No organomegaly or mass.  EXTREMITIES: No pedal edema, cyanosis, or clubbing.  NEUROLOGIC: Cranial nerves II through XII are intact.  Muscle strength 4/5 in all extremities. Sensation intact. Gait not checked.  PSYCHIATRIC: The patient is alert and oriented x 3.  SKIN: No obvious rash, lesion, or ulcer.   DATA REVIEW:   CBC   Recent Labs Lab 03/29/15 1844  WBC 7.6  HGB 13.5  HCT 41.8  PLT 185    Chemistries   Recent Labs Lab 03/29/15 1844  NA 134*  K 4.9  CL 106  CO2 22  GLUCOSE 93  BUN 15  CREATININE 0.77  CALCIUM 9.8    Microbiology Results   Recent Results (from the past 240 hour(s))  Blood culture (routine x 2)     Status: None (Preliminary result)   Collection Time: 03/29/15 10:55 PM  Result Value Ref Range Status   Specimen Description BLOOD RIGHT ANTECUBITAL  Final   Special Requests BOTTLES DRAWN AEROBIC AND ANAEROBIC 10ML  Final   Culture NO GROWTH < 12 HOURS  Final   Report Status PENDING  Incomplete  Blood culture (routine x 2)     Status: None (Preliminary result)   Collection Time: 03/29/15 10:56  PM  Result Value Ref Range Status   Specimen Description BLOOD LEFT ANTECUBITAL  Final   Special Requests BOTTLES DRAWN AEROBIC AND ANAEROBIC 5ML  Final   Culture NO GROWTH < 12 HOURS  Final   Report Status PENDING  Incomplete    RADIOLOGY:  Dg Chest 2 View  03/29/2015  CLINICAL DATA:  Shortness of breath today.  Initial encounter. EXAM: CHEST  2 VIEW COMPARISON:  Single view of the chest 06/06/2012. PA and lateral chest 03/18/2010. FINDINGS: There is mild cardiomegaly without edema. No consolidative process, pneumothorax or effusion. No focal bony abnormality. IMPRESSION: No acute disease. Electronically Signed   By: Inge Rise M.D.   On: 03/29/2015 19:11   Ct Maxillofacial Wo Cm  03/29/2015  CLINICAL DATA:  Pre articular swelling bilaterally for 3 days, worse on the left. Initial encounter. EXAM: CT MAXILLOFACIAL WITHOUT CONTRAST TECHNIQUE: Multidetector CT imaging of the maxillofacial structures was performed. Multiplanar CT image reconstructions were also generated. A small  metallic BB was placed on the right temple in order to reliably differentiate right from left. COMPARISON:  None. FINDINGS: A marker is placed in the region of concern and overlies the left parotid gland. No underlying fluid collection or mass identified. The left parotid gland is more prominent than the right. No ductal dilatation or stone is identified. The submandibular glands are atrophic. No lymphadenopathy or mass is seen. The patient is status post bilateral lens extraction. Imaged intracranial contents demonstrate cortical atrophy. Imaged paranasal sinuses are clear. The patient has marked multilevel cervical spondylosis. No lytic or sclerotic bony lesion is identified. IMPRESSION: Although both parotid glands demonstrate fatty atrophy, the left parotid is more prominent than the right possibly due to infectious or inflammatory process (parotiditis). No stone or mass is identified. Cervical spondylosis. Electronically Signed   By: Inge Rise M.D.   On: 03/29/2015 22:18     Management plans discussed with the patient, family and they are in agreement.  CODE STATUS:     Code Status Orders        Start     Ordered   03/29/15 2306  Full code   Continuous     03/29/15 2306    Code Status History    Date Active Date Inactive Code Status Order ID Comments User Context   This patient has a current code status but no historical code status.      TOTAL TIME TAKING CARE OF THIS PATIENT: 40 minutes.    Krystalyn Kubota M.D on 03/30/2015 at 11:48 AM  Between 7am to 6pm - Pager - [phone] After 6pm go to [email or web address] - password [email or web address] [facility]  Office  [phone]  CC: Primary care physician; Morton Peters, MD

## 2015-04-03 LAB — CULTURE, BLOOD (ROUTINE X 2)
CULTURE: NO GROWTH
Culture: NO GROWTH

## 2015-04-17 ENCOUNTER — Ambulatory Visit (INDEPENDENT_AMBULATORY_CARE_PROVIDER_SITE_OTHER): Payer: Medicare Other | Admitting: Internal Medicine

## 2015-04-17 ENCOUNTER — Encounter: Payer: Self-pay | Admitting: Internal Medicine

## 2015-04-17 VITALS — BP 104/62 | HR 55 | Temp 98.2°F | Ht 59.0 in | Wt 102.0 lb

## 2015-04-17 DIAGNOSIS — J4530 Mild persistent asthma, uncomplicated: Secondary | ICD-10-CM

## 2015-04-17 DIAGNOSIS — M3500 Sicca syndrome, unspecified: Secondary | ICD-10-CM

## 2015-04-17 DIAGNOSIS — F32A Depression, unspecified: Secondary | ICD-10-CM

## 2015-04-17 DIAGNOSIS — I4891 Unspecified atrial fibrillation: Secondary | ICD-10-CM

## 2015-04-17 DIAGNOSIS — M1990 Unspecified osteoarthritis, unspecified site: Secondary | ICD-10-CM | POA: Diagnosis not present

## 2015-04-17 DIAGNOSIS — I10 Essential (primary) hypertension: Secondary | ICD-10-CM

## 2015-04-17 DIAGNOSIS — F329 Major depressive disorder, single episode, unspecified: Secondary | ICD-10-CM | POA: Insufficient documentation

## 2015-04-17 DIAGNOSIS — K219 Gastro-esophageal reflux disease without esophagitis: Secondary | ICD-10-CM

## 2015-04-17 DIAGNOSIS — J45909 Unspecified asthma, uncomplicated: Secondary | ICD-10-CM | POA: Insufficient documentation

## 2015-04-17 MED ORDER — APIXABAN 2.5 MG PO TABS: 2.5000 mg | ORAL_TABLET | Freq: Two times a day (BID) | ORAL | Status: DC

## 2015-04-17 MED ORDER — PAROXETINE HCL 20 MG PO TABS: 20.0000 mg | ORAL_TABLET | Freq: Every day | ORAL | Status: DC

## 2015-04-17 MED ORDER — METOPROLOL TARTRATE 50 MG PO TABS: 50.0000 mg | ORAL_TABLET | Freq: Two times a day (BID) | ORAL | Status: DC

## 2015-04-17 MED ORDER — HYDROCODONE-ACETAMINOPHEN 7.5-325 MG PO TABS: 1.0000 | ORAL_TABLET | Freq: Three times a day (TID) | ORAL | Status: DC | PRN

## 2015-04-17 MED ORDER — BUDESONIDE-FORMOTEROL FUMARATE 160-4.5 MCG/ACT IN AERO: 2.0000 | INHALATION_SPRAY | Freq: Two times a day (BID) | RESPIRATORY_TRACT | Status: DC

## 2015-04-17 MED ORDER — POTASSIUM CHLORIDE CRYS ER 10 MEQ PO TBCR: 10.0000 meq | EXTENDED_RELEASE_TABLET | Freq: Every day | ORAL | Status: DC

## 2015-04-17 MED ORDER — AMLODIPINE BESYLATE 5 MG PO TABS: 5.0000 mg | ORAL_TABLET | Freq: Every day | ORAL | Status: DC

## 2015-04-17 MED ORDER — OMEPRAZOLE 20 MG PO CPDR: 20.0000 mg | DELAYED_RELEASE_CAPSULE | Freq: Two times a day (BID) | ORAL | Status: DC

## 2015-04-17 MED ORDER — ALBUTEROL SULFATE HFA 108 (90 BASE) MCG/ACT IN AERS: 1.0000 | INHALATION_SPRAY | Freq: Four times a day (QID) | RESPIRATORY_TRACT | Status: DC | PRN

## 2015-04-17 MED ORDER — PILOCARPINE HCL 5 MG PO TABS: 5.0000 mg | ORAL_TABLET | Freq: Two times a day (BID) | ORAL | Status: DC

## 2015-04-17 NOTE — Patient Instructions (Signed)

## 2015-04-17 NOTE — Assessment & Plan Note (Signed)
Chronic but stable Continue Paxil

## 2015-04-17 NOTE — Assessment & Plan Note (Signed)
Continue Pilocarpine

## 2015-04-17 NOTE — Assessment & Plan Note (Signed)
Continue Metoprolol and Eliquis Referral placed to Cardiology for further evaluation

## 2015-04-17 NOTE — Assessment & Plan Note (Signed)
Generalized Will continue Norco TID, will not increase dose or quantity Will get CSA and UDS at next visit

## 2015-04-17 NOTE — Assessment & Plan Note (Signed)
Continue Symbicort and Albuterol

## 2015-04-17 NOTE — Assessment & Plan Note (Signed)
Stable on Prilosec Consider cutting back to daily dosing

## 2015-04-17 NOTE — Assessment & Plan Note (Signed)
I do not think she needs the Norvasc but she does not want to stop it today Continue to monitor for low blood pressure

## 2015-04-17 NOTE — Progress Notes (Signed)
HPI  Pt presents to the clinic today to establish care and for management of the conditions listed below. She is transferring care from Dr. Hardin Negus.  Afib: Diagnosed with afib last year. She takes Lopressor and Eliquis as prescribed. She does need to establish with a cardiologist in the area.  Arthritis: All over. She takes the Hydrocodone three times daily with good relief.  Asthma: She takes Symbicort as prescribed. She rarely uses the Albuterol. She gets SOB with walking up stairs.  Sjoegren's: She take Pilocarpine as prescribed. She reports it is not as effective as the last medication she was on but her insurance would no longer pay for it. She feels like food does not taste like it should due to her lack of saliva production.  GERD: She takes Omeprazole daily. She does take a Probiotic as well. She denies breakthrough symptoms.  Depression: Chronic but stable. She takes Paxil as prescribed. She denies SI/HI.  HTN: She takes Amlodipine and Metoprolol. She denies weakness or dizziness.  Flu: 12/2014 Tetanus: unsure Pneumovax: unsure Prevnar: unsure Zostovax: unsure Pap Smear: unsure  Mammogram: unsure Colon Screening: never Vision Screening: Has appt scheduled tomorrow Dentist: as needed    Past Medical History  Diagnosis Date  . A-fib ([place])   . Arthritis   . Asthma   . Sjoegren syndrome ([place])   . Acid reflux   . Blood transfusion without reported diagnosis   . Allergy   . Depression   . Hypertension   . Glaucoma   . History of stomach ulcers     Current Outpatient Prescriptions  Medication Sig Dispense Refill  . albuterol (VENTOLIN HFA) 108 (90 Base) MCG/ACT inhaler Inhale 1-2 puffs into the lungs every 6 (six) hours as needed for wheezing or shortness of breath.    Marland Kitchen amLODipine (NORVASC) 5 MG tablet Take 5 mg by mouth daily.    Marland Kitchen apixaban (ELIQUIS) 2.5 MG TABS tablet Take 2.5 mg by mouth 2 (two) times daily.    . brimonidine (ALPHAGAN P) 0.1 % SOLN Apply 1  drop to eye 2 (two) times daily.    . budesonide-formoterol (SYMBICORT) 160-4.5 MCG/ACT inhaler Inhale 2 puffs into the lungs 2 (two) times daily.    . ferrous sulfate 325 (65 FE) MG tablet Take 325 mg by mouth daily with breakfast.    . HYDROcodone-acetaminophen (NORCO) 7.5-325 MG tablet Take 1 tablet by mouth 3 (three) times daily as needed for moderate pain.    . Melatonin 3 MG TABS Take 3 mg by mouth at bedtime.    . metoprolol (LOPRESSOR) 50 MG tablet Take 50 mg by mouth 2 (two) times daily.    . Multiple Vitamins-Minerals (VITEYES AREDS FORMULA PO) Take 2 tablets by mouth daily.    Marland Kitchen omeprazole (PRILOSEC) 20 MG capsule Take 20 mg by mouth 2 (two) times daily before a meal.    . PARoxetine (PAXIL) 20 MG tablet Take 1 tablet by mouth daily.    . pilocarpine (SALAGEN) 5 MG tablet Take 5 mg by mouth 2 (two) times daily.     . potassium chloride (K-DUR,KLOR-CON) 10 MEQ tablet Take 10 mEq by mouth daily.     . Probiotic Product (PROBIOTIC DAILY PO) Take 1 tablet by mouth daily.     No current facility-administered medications for this visit.    Allergies  Allergen Reactions  . Penicillins Anaphylaxis and Hives    States "almost died" Has patient had a PCN reaction causing immediate rash, facial/tongue/throat swelling, SOB or lightheadedness  with hypotension: Yes Has patient had a PCN reaction causing severe rash involving mucus membranes or skin necrosis: No Has patient had a PCN reaction that required hospitalization No Has patient had a PCN reaction occurring within the last 10 years: No If all of the above answers are "NO", then may proceed with Cephalosporin use.  . Codeine Hives    Family History  Problem Relation Age of Onset  . Diabetes Neg Hx   . Heart disease Mother   . Stroke Mother   . Mental illness Mother   . Emphysema Mother   . Lung cancer Father   . Arthritis Sister   . Alcohol abuse Paternal Aunt   . Alcohol abuse Paternal Uncle     Social History   Social  History  . Marital Status: Divorced    Spouse Name: N/A  . Number of Children: N/A  . Years of Education: N/A   Occupational History  . Not on file.   Social History Main Topics  . Smoking status: Never Smoker   . Smokeless tobacco: Never Used  . Alcohol Use: No  . Drug Use: Not on file  . Sexual Activity: Not on file   Other Topics Concern  . Not on file   Social History Narrative    ROS:  Constitutional: Pt reports weight loss. Denies fever, malaise, fatigue, headache.  Respiratory: Denies difficulty breathing, shortness of breath, cough or sputum production.   Cardiovascular: Denies chest pain, chest tightness, palpitations or swelling in the hands or feet.  Gastrointestinal: Denies abdominal pain, bloating, constipation, diarrhea or blood in the stool.  GU: Denies frequency, urgency, pain with urination, blood in urine, odor or discharge. Musculoskeletal: Pt reports joint pain. Denies decrease in range of motion, difficulty with gait, muscle pain or joint swelling.  Skin: Denies redness, rashes, lesions or ulcercations.  Neurological: Pt reports difficulty hearing, and problems with balance and coordination. Denies dizziness, difficulty with memory, difficulty with speech.  Psych: Pt reports chronic depression. Denies anxiety, SI/HI.  No other specific complaints in a complete review of systems (except as listed in HPI above).  PE:  BP 104/62 mmHg  Temp(Src) 98.2 F (36.8 C) (Oral)  Ht 4\' 11"  (1.499 m)  Wt 102 lb (46.267 kg)  BMI 20.59 kg/m2 Wt Readings from Last 3 Encounters:  04/17/15 102 lb (46.267 kg)  03/30/15 99 lb 11.2 oz (45.224 kg)    General: Appears her stated age, chronically ill appearing, in NAD. Skin: Dry and intact. Cardiovascular: Normal rate with irregular rhythm. S1,S2 noted.  No murmur, rubs or gallops noted. No JVD or BLE edema. No carotid bruits noted. Pulmonary/Chest: Normal effort and positive vesicular breath sounds. No respiratory  distress. No wheezes, rales or ronchi noted.  Abdomen: Soft and nontender. . Musculoskeletal: Using walker for assistance with gait. Neurological: Alert and oriented. HOH.  Psychiatric: Mood and affect normal. Behavior is normal. Judgment and thought content normal.    BMET    Component Value Date/Time   NA 134* 03/29/2015 1844   NA 142 06/06/2012 1322   K 4.9 03/29/2015 1844   K 3.5 06/06/2012 1322   CL 106 03/29/2015 1844   CL 114* 06/06/2012 1322   CO2 22 03/29/2015 1844   CO2 21 06/06/2012 1322   GLUCOSE 93 03/29/2015 1844   GLUCOSE 95 06/06/2012 1322   BUN 15 03/29/2015 1844   BUN 14 06/06/2012 1322   CREATININE 0.77 03/29/2015 1844   CREATININE 0.97 06/06/2012 1322   CALCIUM 9.8  03/29/2015 1844   CALCIUM 9.8 06/06/2012 1322   GFRNONAA >60 03/29/2015 1844   GFRNONAA 55* 06/06/2012 1322   GFRAA >60 03/29/2015 1844   GFRAA >60 06/06/2012 1322    Lipid Panel  No results found for: CHOL, TRIG, HDL, CHOLHDL, VLDL, LDLCALC  CBC    Component Value Date/Time   WBC 7.6 03/29/2015 1844   WBC 9.4 06/06/2012 1322   RBC 4.32 03/29/2015 1844   RBC 4.06 06/06/2012 1322   HGB 13.5 03/29/2015 1844   HGB 10.9* 06/06/2012 1322   HCT 41.8 03/29/2015 1844   HCT 33.4* 06/06/2012 1322   PLT 185 03/29/2015 1844   PLT 404 06/06/2012 1322   MCV 96.8 03/29/2015 1844   MCV 82 06/06/2012 1322   MCH 31.2 03/29/2015 1844   MCH 26.8 06/06/2012 1322   MCHC 32.2 03/29/2015 1844   MCHC 32.6 06/06/2012 1322   RDW 13.5 03/29/2015 1844   RDW 15.8* 06/06/2012 1322    Hgb A1C No results found for: HGBA1C   Assessment and Plan:

## 2015-04-17 NOTE — Progress Notes (Signed)
Pre visit review using our clinic review tool, if applicable. No additional management support is needed unless otherwise documented below in the visit note. 

## 2015-04-18 DIAGNOSIS — H401132 Primary open-angle glaucoma, bilateral, moderate stage: Secondary | ICD-10-CM | POA: Diagnosis not present

## 2015-04-22 ENCOUNTER — Encounter: Payer: Self-pay | Admitting: Internal Medicine

## 2015-04-26 ENCOUNTER — Encounter: Payer: Self-pay | Admitting: Internal Medicine

## 2015-05-14 ENCOUNTER — Encounter: Payer: Self-pay | Admitting: Cardiology

## 2015-05-14 ENCOUNTER — Ambulatory Visit (INDEPENDENT_AMBULATORY_CARE_PROVIDER_SITE_OTHER): Payer: Medicare Other | Admitting: Cardiology

## 2015-05-14 VITALS — BP 114/60 | HR 76 | Ht 59.0 in | Wt 105.2 lb

## 2015-05-14 DIAGNOSIS — I482 Chronic atrial fibrillation, unspecified: Secondary | ICD-10-CM

## 2015-05-14 DIAGNOSIS — R0602 Shortness of breath: Secondary | ICD-10-CM | POA: Diagnosis not present

## 2015-05-14 DIAGNOSIS — I10 Essential (primary) hypertension: Secondary | ICD-10-CM | POA: Diagnosis not present

## 2015-05-14 DIAGNOSIS — I4821 Permanent atrial fibrillation: Secondary | ICD-10-CM | POA: Insufficient documentation

## 2015-05-14 NOTE — Patient Instructions (Addendum)
Medication Instructions:  Your physician has recommended you make the following change in your medication: STOP taking Amlodipine   Labwork: None Ordered  Testing/Procedures: Your physician has requested that you have an echocardiogram. Echocardiography is a painless test that uses sound waves to create images of your heart. It provides your doctor with information about the size and shape of your heart and how well your heart's chambers and valves are working. This procedure takes approximately one hour. There are no restrictions for this procedure.  Date & Time: ______________________________________________________  Your physician has recommended that you wear a holter monitor. Holter monitors are medical devices that record the heart's electrical activity. Doctors most often use these monitors to diagnose arrhythmias. Arrhythmias are problems with the speed or rhythm of the heartbeat. The monitor is a small, portable device. You can wear one while you do your normal daily activities. This is usually used to diagnose what is causing palpitations/syncope (passing out).  Date & Time: __________________________________________________________  Your physician has requested that you have a lexiscan myoview. For further information please visit [email or web address]. Please follow instruction sheet, as given.  Date & Time: _______March 17, 2017 at 07:30 AM_____________________________  Follow-Up: Your physician recommends that you schedule a follow-up appointment after testing to review results.  Date & Time: ______________________________________________________________   Any Other Special Instructions Will Be Listed Below (If Applicable).  [place]  Your caregiver has ordered a Stress Test with nuclear imaging. The purpose of this test is to evaluate the blood supply to your heart muscle. This procedure is referred to as a "Non-Invasive Stress Test." This is because other than having  an IV started in your vein, nothing is inserted or "invades" your body. Cardiac stress tests are done to find areas of poor blood flow to the heart by determining the extent of coronary artery disease (CAD). Some patients exercise on a treadmill, which naturally increases the blood flow to your heart, while others who are  unable to walk on a treadmill due to physical limitations have a pharmacologic/chemical stress agent called Lexiscan . This medicine will mimic walking on a treadmill by temporarily increasing your coronary blood flow.   Please note: these test may take anywhere between 2-4 hours to complete  PLEASE REPORT TO [facility] AT THE FIRST DESK WILL DIRECT YOU WHERE TO GO  Date of Procedure:____March 17, 2017 at 07:30 AM__________________  Arrival Time for Procedure:_____Arrive at 07:15 AM____________  Instructions regarding medication:   __X__:  Hold betablocker(s) night before procedure and morning of procedure  Hold Metoprolol also known as Lopressor the night before and morning of testing.   PLEASE NOTIFY THE OFFICE AT LEAST 79 HOURS IN ADVANCE IF YOU ARE UNABLE TO KEEP YOUR APPOINTMENT.  [phone] AND  PLEASE NOTIFY NUCLEAR MEDICINE AT [facility] AT LEAST 24 HOURS IN ADVANCE IF YOU ARE UNABLE TO KEEP YOUR APPOINTMENT. [phone]  How to prepare for your Myoview test:  1. Do not eat or drink after midnight 2. No caffeine for 24 hours prior to test 3. No smoking 24 hours prior to test. 4. Your medication may be taken with water.  If your doctor stopped a medication because of this test, do not take that medication. 5. Ladies, please do not wear dresses.  Skirts or pants are appropriate. Please wear a short sleeve shirt. 6. No perfume, cologne or lotion. 7. Wear comfortable walking shoes. No heels!  If you need a refill on your cardiac medications before your next appointment, please call your  pharmacy.  Echocardiogram An echocardiogram, or echocardiography, uses sound waves (ultrasound) to produce an image of your heart. The echocardiogram is simple, painless, obtained within a short period of time, and offers valuable information to your health care provider. The images from an echocardiogram can provide information such as:  Evidence of coronary artery disease (CAD).  Heart size.  Heart muscle function.  Heart valve function.  Aneurysm detection.  Evidence of a past heart attack.  Fluid buildup around the heart.  Heart muscle thickening.  Assess heart valve function. LET [facility] CARE PROVIDER KNOW ABOUT:  Any allergies you have.  All medicines you are taking, including vitamins, herbs, eye drops, creams, and over-the-counter medicines.  Previous problems you or members of your family have had with the use of anesthetics.  Any blood disorders you have.  Previous surgeries you have had.  Medical conditions you have.  Possibility of pregnancy, if this applies. BEFORE THE PROCEDURE  No special preparation is needed. Eat and drink normally.  PROCEDURE  8. In order to produce an image of your heart, gel will be applied to your chest and a wand-like tool (transducer) will be moved over your chest. The gel will help transmit the sound waves from the transducer. The sound waves will harmlessly bounce off your heart to allow the heart images to be captured in real-time motion. These images will then be recorded. 9. You may need an IV to receive a medicine that improves the quality of the pictures. AFTER THE PROCEDURE You may return to your normal schedule including diet, activities, and medicines, unless your health care provider tells you otherwise.   This information is not intended to replace advice given to you by your health care provider. Make sure you discuss any questions you have with your health care provider.   Document Released: 02/15/2000 Document  Revised: 03/10/2014 Document Reviewed: 10/25/2012 Elsevier Interactive Patient Education 2016 Elsevier Inc.   Holter Monitoring A Holter monitor is a small device that is used to detect abnormal heart rhythms. It clips to your clothing and is connected by wires to flat, sticky disks (electrodes) that attach to your chest. It is worn continuously for 24-48 hours. HOME CARE INSTRUCTIONS  Wear your Holter monitor at all times, even while exercising and sleeping, for as long as directed by your health care provider.  Make sure that the Holter monitor is safely clipped to your clothing or close to your body as recommended by your health care provider.  Do not get the monitor or wires wet.  Do not put body lotion or moisturizer on your chest.  Keep your skin clean.  Keep a diary of your daily activities, such as walking and doing chores. If you feel that your heartbeat is abnormal or that your heart is fluttering or skipping a beat:  Record what you are doing when it happens.  Record what time of day the symptoms occur.  Return your Holter monitor as directed by your health care provider.  Keep all follow-up visits as directed by your health care provider. This is important. SEEK IMMEDIATE MEDICAL CARE IF:  You feel lightheaded or you faint.  You have trouble breathing.  You feel pain in your chest, upper arm, or jaw.  You feel sick to your stomach and your skin is pale, cool, or damp.  You heartbeat feels unusual or abnormal.   This information  is not intended to replace advice given to you by your health care provider. Make sure you discuss any questions you have with your health care provider.   Document Released: 11/16/2003 Document Revised: 03/10/2014 Document Reviewed: 09/26/2013 Elsevier Interactive Patient Education 2016 [place].   Pharmacologic Stress Electrocardiogram A pharmacologic stress electrocardiogram is a heart (cardiac) test that uses nuclear imaging  to evaluate the blood supply to your heart. This test may also be called a pharmacologic stress electrocardiography. Pharmacologic means that a medicine is used to increase your heart rate and blood pressure.  This stress test is done to find areas of poor blood flow to the heart by determining the extent of coronary artery disease (CAD). Some people exercise on a treadmill, which naturally increases the blood flow to the heart. For those people unable to exercise on a treadmill, a medicine is used. This medicine stimulates your heart and will cause your heart to beat harder and more quickly, as if you were exercising.  Pharmacologic stress tests can help determine:  The adequacy of blood flow to your heart during increased levels of activity in order to clear you for discharge home.  The extent of coronary artery blockage caused by CAD.  Your prognosis if you have suffered a heart attack.  The effectiveness of cardiac procedures done, such as an angioplasty, which can increase the circulation in your coronary arteries.  Causes of chest pain or pressure. LET [facility] CARE PROVIDER KNOW ABOUT:  Any allergies you have.  All medicines you are taking, including vitamins, herbs, eye drops, creams, and over-the-counter medicines.  Previous problems you or members of your family have had with the use of anesthetics.  Any blood disorders you have.  Previous surgeries you have had.  Medical conditions you have.  Possibility of pregnancy, if this applies.  If you are currently breastfeeding. RISKS AND COMPLICATIONS Generally, this is a safe procedure. However, as with any procedure, complications can occur. Possible complications include: 10. You develop pain or pressure in the following areas: 1. Chest. 2. Jaw or neck. 3. Between your shoulder blades. 4. Radiating down your left arm. 11. Headache. 12. Dizziness or light-headedness. 13. Shortness of breath. 14. Increased or irregular  heartbeat. 15. Low blood pressure. 16. Nausea or vomiting. 17. Flushing. 18. Redness going up the arm and slight pain during injection of medicine. 19. Heart attack (rare). BEFORE THE PROCEDURE   Avoid all forms of caffeine for 24 hours before your test or as directed by your health care provider. This includes coffee, tea (even decaffeinated tea), caffeinated sodas, chocolate, cocoa, and certain pain medicines.  Follow your health care provider's instructions regarding eating and drinking before the test.  Take your medicines as directed at regular times with water unless instructed otherwise. Exceptions may include:  If you have diabetes, ask how you are to take your insulin or pills. It is common to adjust insulin dosing the morning of the test.  If you are taking beta-blocker medicines, it is important to talk to your health care provider about these medicines well before the date of your test. Taking beta-blocker medicines may interfere with the test. In some cases, these medicines need to be changed or stopped 24 hours or more before the test.  If you wear a nitroglycerin patch, it may need to be removed prior to the test. Ask your health care provider if the patch should be removed before the test.  If you use an inhaler for any breathing  condition, bring it with you to the test.  If you are an outpatient, bring a snack so you can eat right after the stress phase of the test.  Do not smoke for 4 hours prior to the test or as directed by your health care provider.  Do not apply lotions, powders, creams, or oils on your chest prior to the test.  Wear comfortable shoes and clothing. Let your health care provider know if you were unable to complete or follow the preparations for your test. PROCEDURE   Multiple patches (electrodes) will be put on your chest. If needed, small areas of your chest may be shaved to get better contact with the electrodes. Once the electrodes are attached  to your body, multiple wires will be attached to the electrodes, and your heart rate will be monitored.  An IV access will be started. A nuclear trace (isotope) is given. The isotope may be given intravenously, or it may be swallowed. Nuclear refers to several types of radioactive isotopes, and the nuclear isotope lights up the arteries so that the nuclear images are clear. The isotope is absorbed by your body. This results in low radiation exposure.  A resting nuclear image is taken to show how your heart functions at rest.  A medicine is given through the IV access.  A second scan is done about 1 hour after the medicine injection and determines how your heart functions under stress.  During this stress phase, you will be connected to an electrocardiogram machine. Your blood pressure and oxygen levels will be monitored. AFTER THE PROCEDURE   Your heart rate and blood pressure will be monitored after the test.  You may return to your normal schedule, including diet,activities, and medicines, unless your health care provider tells you otherwise.   This information is not intended to replace advice given to you by your health care provider. Make sure you discuss any questions you have with your health care provider.   Document Released: 07/06/2008 Document Revised: 02/22/2013 Document Reviewed: 10/25/2012 Elsevier Interactive Patient Education [employer].

## 2015-05-14 NOTE — Progress Notes (Signed)
Cardiology Office Note   Date:  05/14/2015   ID:  Tiffani Campbel, DOB [birth year], MRN [id number]  Referring Doctor:  Webb Silversmith, NP   Cardiologist:   Wende Bushy, MD   Reason for consultation:  Chief Complaint  Patient presents with  . OTHER    Afib. Discuss BP medications and blood thinner. Meds reviewed verbally with pt.      History of Present Illness: Peggy Carter is a 82 y.o. female who presents for atrial fibrillation. Patient would like to establish care with cardiology.  She reports being diagnosed with this some time last year after procedure. It was noted on an EKG. She was started on a blood thinner as well at that time. She does not report feeling the rhythm change. No chest pain or palpitations. She reports some minor to moderate bleeding from taking the eliquis. No recent falls or severe bleeding.  She does have some shortness of breath. Shortness breath as been going on first quite some time usually with taking stairs. Resolve with rest. Symptoms mainly in the chest, [email or web address] to moderate in severity. Symptoms lasting minutes at a time.  In terms of her hypertension, she does not regularly check her blood pressure. Her son is concerned about her having to take 2 blood pressure medications.  Patient denies fever, cough, colds, abdominal pain, orthopnea, PND, edema.   ROS:  Please see the history of present illness. Aside from mentioned under HPI, all other systems are reviewed and negative.     Past Medical History  Diagnosis Date  . A-fib ([place])   . Arthritis   . Asthma   . Sjoegren syndrome ([place])   . Acid reflux   . Blood transfusion without reported diagnosis   . Allergy   . Depression   . Hypertension   . Glaucoma   . History of stomach ulcers     Past Surgical History  Procedure Laterality Date  . Abdominal hysterectomy    . Tonsillectomy    . Wrist surgery Left   . Forearm surgery Right      reports that she has never  smoked. She has never used smokeless tobacco. She reports that she does not drink alcohol or use illicit drugs.   family history includes Alcohol abuse in her paternal aunt and paternal uncle; Arthritis in her sister; Emphysema in her mother; Heart disease in her mother; Lung cancer in her father; Mental illness in her mother; Stroke in her mother. There is no history of Diabetes.   Current Outpatient Prescriptions  Medication Sig Dispense Refill  . albuterol (VENTOLIN HFA) 108 (90 Base) MCG/ACT inhaler Inhale 1-2 puffs into the lungs every 6 (six) hours as needed for wheezing or shortness of breath. 1 Inhaler 5  . apixaban (ELIQUIS) 2.5 MG TABS tablet Take 1 tablet (2.5 mg total) by mouth 2 (two) times daily. 180 tablet 1  . brimonidine (ALPHAGAN P) 0.1 % SOLN Apply 1 drop to eye 2 (two) times daily.    . budesonide-formoterol (SYMBICORT) 160-4.5 MCG/ACT inhaler Inhale 2 puffs into the lungs 2 (two) times daily. 1 Inhaler 5  . ferrous sulfate 325 (65 FE) MG tablet Take 325 mg by mouth daily with breakfast.    . HYDROcodone-acetaminophen (NORCO) 7.5-325 MG tablet Take 1 tablet by mouth 3 (three) times daily as needed for moderate pain. Fill on or after 04/21/15 90 tablet 0  . Melatonin 3 MG TABS Take 3 mg by mouth at bedtime.    . metoprolol (  LOPRESSOR) 50 MG tablet Take 1 tablet (50 mg total) by mouth 2 (two) times daily. 180 tablet 1  . Multiple Vitamins-Minerals (VITEYES AREDS FORMULA PO) Take 2 tablets by mouth daily.    Marland Kitchen omeprazole (PRILOSEC) 20 MG capsule Take 1 capsule (20 mg total) by mouth 2 (two) times daily before a meal. 180 capsule 1  . PARoxetine (PAXIL) 20 MG tablet Take 1 tablet (20 mg total) by mouth daily. 90 tablet 1  . pilocarpine (SALAGEN) 5 MG tablet Take 1 tablet (5 mg total) by mouth 2 (two) times daily. 180 tablet 1  . potassium chloride (K-DUR,KLOR-CON) 10 MEQ tablet Take 1 tablet (10 mEq total) by mouth daily. 90 tablet 1  . Probiotic Product (PROBIOTIC DAILY PO) Take 1  tablet by mouth daily.     No current facility-administered medications for this visit.    Allergies: Penicillins and Codeine    PHYSICAL EXAM: VS:  BP 114/60 mmHg  Pulse 76  Ht 4\' 11"  (1.499 m)  Wt 105 lb 4 oz (47.741 kg)  BMI 21.25 kg/m2 , Body mass index is 21.25 kg/(m^2). Wt Readings from Last 3 Encounters:  05/14/15 105 lb 4 oz (47.741 kg)  04/17/15 102 lb (46.267 kg)  03/30/15 99 lb 11.2 oz (45.224 kg)    GENERAL:  well developed, well nourished,  not in acute distress HEENT: normocephalic, pink conjunctivae, anicteric sclerae, no xanthelasma, normal dentition, oropharynx clear NECK:  no neck vein engorgement, JVP normal, no hepatojugular reflux, carotid upstroke brisk and symmetric, no bruit, no thyromegaly, no lymphadenopathy LUNGS:  good respiratory effort, clear to auscultation bilaterally CV:  PMI not displaced, no thrills, no lifts, S2 within normal limits, no palpable S3 or S4, no murmurs, no rubs, no gallops, irregularly irregular rhythm ABD:  Soft, nontender, nondistended, normoactive bowel sounds, no abdominal aortic bruit, no hepatomegaly, no splenomegaly MS: nontender back, no kyphosis, no scoliosis, no joint deformities EXT:  2+ DP/PT pulses, no edema, no varicosities, no cyanosis, no clubbing SKIN: warm, nondiaphoretic, normal turgor, no ulcers NEUROPSYCH: alert, oriented to person, place, and time, sensory/motor grossly intact, normal mood, appropriate affect  Recent Labs: 03/29/2015: BUN 15; Creatinine, Ser 0.77; Hemoglobin 13.5; Platelets 185; Potassium 4.9; Sodium 134*; TSH 0.962   Lipid Panel No results found for: CHOL, TRIG, HDL, CHOLHDL, VLDL, LDLCALC, LDLDIRECT   Other studies Reviewed:  EKG:  EKG is ordered today.05/14/2015  The ekg ordered today was personally reviewed by me and it reveals atrial fibrillation 76 BPM. Nonspecific ST-T wave changes.  Additional studies/ records that were reviewed personally reviewed by me today include:  None  available  ASSESSMENT AND PLAN:  Atrial fibrillation, likely permanent. Patient does not feel any palpitations. She does have some shortness of breath with exertion. No chest pain. Currently heart rate is controlled with current dose of metoprolol. Patient  Takes her oral anticoagulant as prescribed.agree with current dosing, due to age and weight. Discussed in detail the importance of oral anticoagulation to reduce her risk of stroke. Discussed importance of using her walker with walking, avoiding working with sharp knives/objects. Recommend Holter to look at heart  Rate range. Check echocardiogram.  Shortness of breath Patient thinks this is from her COPD/emphysema. Patient has risk factors for coronary artery disease. Recommend ischemia evaluation with stress testing.  Hypertension Blood pressure within normal limits. Recommend blood pressure monitoring/BP log. Discussed with patient and her son about holding amlodipine for now and continue to monitor blood pressure.   Current medicines are reviewed at  length with the patient today.  The patient does not have concerns regarding medicines.  Labs/ tests ordered today include:  Orders Placed This Encounter  Procedures  . NM Myocar Multi W/Spect W/Wall Motion / EF  . Holter monitor - 24 hour  . EKG 12-Lead  . ECHOCARDIOGRAM COMPLETE    I had a lengthy and detailed discussion with the patient regarding diagnoses, prognosis, diagnostic options, treatment options, and side effects of medications.   I counseled the patient on importance of lifestyle modification including heart healthy diet, regular physical activity  Disposition:   FU with undersigned after tests  Signed, Wende Bushy, MD  05/14/2015 11:07 AM    [place]

## 2015-05-16 ENCOUNTER — Other Ambulatory Visit: Payer: Self-pay

## 2015-05-16 MED ORDER — HYDROCODONE-ACETAMINOPHEN 7.5-325 MG PO TABS: 1.0000 | ORAL_TABLET | Freq: Three times a day (TID) | ORAL | Status: DC | PRN

## 2015-05-16 NOTE — Telephone Encounter (Signed)
RX printed and signed and placed in MYD box 

## 2015-05-16 NOTE — Telephone Encounter (Signed)
v/m requesting rx hydrocodone apap. Call when ready for pick up.Last seen and rx last printed # 90 on 04/17/15. Caller can only pick up on 05/17/15.Please advise.

## 2015-05-16 NOTE — Telephone Encounter (Signed)
Rx left in front office for pick up and pt is aware  

## 2015-05-18 ENCOUNTER — Encounter
Admission: RE | Admit: 2015-05-18 | Discharge: 2015-05-18 | Disposition: A | Discharge status: Home / Self Care | Payer: Medicare Other | Source: Ambulatory Visit | Attending: Cardiology | Admitting: Cardiology

## 2015-05-18 DIAGNOSIS — I482 Chronic atrial fibrillation, unspecified: Secondary | ICD-10-CM

## 2015-05-18 DIAGNOSIS — R0602 Shortness of breath: Secondary | ICD-10-CM | POA: Diagnosis not present

## 2015-05-18 LAB — NM MYOCAR MULTI W/SPECT W/WALL MOTION / EF
CHL CUP MPHR: 137 {beats}/min
CHL CUP NUCLEAR SDS: 6
CHL CUP RESTING HR STRESS: 100 {beats}/min
CSEPEW: 1 METS
CSEPHR: 100 %
CSEPPHR: 137 {beats}/min
Exercise duration (min): 0 min
Exercise duration (sec): 0 s
LV dias vol: 40 mL (ref 46–106)
LVSYSVOL: 20 mL
SRS: 3
SSS: 5
TID: 0.85

## 2015-05-18 MED ORDER — TECHNETIUM TC 99M SESTAMIBI - CARDIOLITE
13.3600 | Freq: Once | INTRAVENOUS | Status: AC | PRN
  Administered 2015-05-18: 13.36 via INTRAVENOUS

## 2015-05-18 MED ORDER — TECHNETIUM TC 99M SESTAMIBI - CARDIOLITE
31.7900 | Freq: Once | INTRAVENOUS | Status: AC | PRN
  Administered 2015-05-18: 09:00:00 31.79 via INTRAVENOUS

## 2015-05-18 MED ORDER — REGADENOSON 0.4 MG/5ML IV SOLN
0.4000 mg | Freq: Once | INTRAVENOUS | Status: AC
  Administered 2015-05-18: 0.4 mg via INTRAVENOUS

## 2015-05-31 ENCOUNTER — Ambulatory Visit (INDEPENDENT_AMBULATORY_CARE_PROVIDER_SITE_OTHER): Payer: Medicare Other

## 2015-05-31 ENCOUNTER — Other Ambulatory Visit: Payer: Self-pay

## 2015-05-31 DIAGNOSIS — I482 Chronic atrial fibrillation, unspecified: Secondary | ICD-10-CM

## 2015-05-31 DIAGNOSIS — R0602 Shortness of breath: Secondary | ICD-10-CM

## 2015-06-05 ENCOUNTER — Ambulatory Visit
Admission: RE | Admit: 2015-06-05 | Discharge: 2015-06-05 | Disposition: A | Discharge status: Home / Self Care | Payer: Medicare Other | Source: Ambulatory Visit | Attending: Cardiology | Admitting: Cardiology

## 2015-06-05 DIAGNOSIS — R0602 Shortness of breath: Secondary | ICD-10-CM | POA: Insufficient documentation

## 2015-06-05 DIAGNOSIS — I482 Chronic atrial fibrillation: Secondary | ICD-10-CM | POA: Insufficient documentation

## 2015-06-06 ENCOUNTER — Encounter: Payer: Self-pay | Admitting: Cardiology

## 2015-06-06 ENCOUNTER — Ambulatory Visit (INDEPENDENT_AMBULATORY_CARE_PROVIDER_SITE_OTHER): Payer: Medicare Other | Admitting: Cardiology

## 2015-06-06 VITALS — BP 106/60 | HR 83 | Ht 59.0 in | Wt 105.5 lb

## 2015-06-06 DIAGNOSIS — R0602 Shortness of breath: Secondary | ICD-10-CM

## 2015-06-06 DIAGNOSIS — I482 Chronic atrial fibrillation, unspecified: Secondary | ICD-10-CM

## 2015-06-06 DIAGNOSIS — I10 Essential (primary) hypertension: Secondary | ICD-10-CM

## 2015-06-06 NOTE — Progress Notes (Signed)
Cardiology Office Note   Date:  06/06/2015   ID:  Peggy Carter, DOB 10/22/1929, MRN [id number]  Referring Doctor:  Webb Silversmith, NP   Cardiologist:   Wende Bushy, MD   Reason for consultation:  Chief Complaint  Patient presents with  . other    Follow up from Echo, lexiscan and 24 hour holter monitor. Meds reviewed by the patient's son. "doing well." Pt. c/o some shortness of breath.       History of Present Illness: Peggy Carter is a 82 y.o. female who presents for Follow-up atrial fibrillation.   In terms of atrial fibrillation, again, she does not report feeling any changes in her rhythm.  In terms of the shortness of breath, she continues to have the same symptoms as before no progression.  In terms of her hypertension, she does not regularly check her blood pressure. Her son is concerned about her having to take 2 blood pressure medications.  Patient denies fever, cough, colds, abdominal pain, orthopnea, PND, edema.   ROS:  Please see the history of present illness. Aside from mentioned under HPI, all other systems are reviewed and negative.     Past Medical History  Diagnosis Date  . A-fib ([place])   . Arthritis   . Asthma   . Sjoegren syndrome ([place])   . Acid reflux   . Blood transfusion without reported diagnosis   . Allergy   . Depression   . Hypertension   . Glaucoma   . History of stomach ulcers     Past Surgical History  Procedure Laterality Date  . Abdominal hysterectomy    . Tonsillectomy    . Wrist surgery Left   . Forearm surgery Right      reports that she has never smoked. She has never used smokeless tobacco. She reports that she does not drink alcohol or use illicit drugs.   family history includes Alcohol abuse in her paternal aunt and paternal uncle; Arthritis in her sister; Emphysema in her mother; Heart disease in her mother; Lung cancer in her father; Mental illness in her mother; Stroke in her mother. There is no history  of Diabetes.   Current Outpatient Prescriptions  Medication Sig Dispense Refill  . albuterol (VENTOLIN HFA) 108 (90 Base) MCG/ACT inhaler Inhale 1-2 puffs into the lungs every 6 (six) hours as needed for wheezing or shortness of breath. 1 Inhaler 5  . apixaban (ELIQUIS) 2.5 MG TABS tablet Take 1 tablet (2.5 mg total) by mouth 2 (two) times daily. 180 tablet 1  . brimonidine (ALPHAGAN P) 0.1 % SOLN Apply 1 drop to eye 2 (two) times daily.    . budesonide-formoterol (SYMBICORT) 160-4.5 MCG/ACT inhaler Inhale 2 puffs into the lungs 2 (two) times daily. 1 Inhaler 5  . ferrous sulfate 325 (65 FE) MG tablet Take 325 mg by mouth daily with breakfast.    . HYDROcodone-acetaminophen (NORCO) 7.5-325 MG tablet Take 1 tablet by mouth 3 (three) times daily as needed for moderate pain. Fill on or after 05/19/15 90 tablet 0  . Melatonin 3 MG TABS Take 3 mg by mouth at bedtime.    . metoprolol (LOPRESSOR) 50 MG tablet Take 1 tablet (50 mg total) by mouth 2 (two) times daily. 180 tablet 1  . Multiple Vitamins-Minerals (VITEYES AREDS FORMULA PO) Take 2 tablets by mouth daily.    Marland Kitchen omeprazole (PRILOSEC) 20 MG capsule Take 1 capsule (20 mg total) by mouth 2 (two) times daily before a meal. 180 capsule  1  . PARoxetine (PAXIL) 20 MG tablet Take 1 tablet (20 mg total) by mouth daily. 90 tablet 1  . pilocarpine (SALAGEN) 5 MG tablet Take 1 tablet (5 mg total) by mouth 2 (two) times daily. 180 tablet 1  . potassium chloride (K-DUR,KLOR-CON) 10 MEQ tablet Take 1 tablet (10 mEq total) by mouth daily. 90 tablet 1  . Probiotic Product (PROBIOTIC DAILY PO) Take 1 tablet by mouth daily.     No current facility-administered medications for this visit.    Allergies: Penicillins and Codeine    PHYSICAL EXAM: VS:  BP 106/60 mmHg  Pulse 83  Ht 4\' 11"  (1.499 m)  Wt 105 lb 8 oz (47.854 kg)  BMI 21.30 kg/m2  SpO2 98% , Body mass index is 21.3 kg/(m^2). Wt Readings from Last 3 Encounters:  06/06/15 105 lb 8 oz (47.854 kg)    05/14/15 105 lb 4 oz (47.741 kg)  04/17/15 102 lb (46.267 kg)    GENERAL:  well developed, well nourished,  not in acute distress HEENT: normocephalic, pink conjunctivae, anicteric sclerae, no xanthelasma, normal dentition, oropharynx clear NECK:  no neck vein engorgement, JVP normal, no hepatojugular reflux, carotid upstroke brisk and symmetric, no bruit, no thyromegaly, no lymphadenopathy LUNGS:  good respiratory effort, clear to auscultation bilaterally CV:  PMI not displaced, no thrills, no lifts, S2 within normal limits, no palpable S3 or S4, no murmurs, no rubs, no gallops, irregularly irregular rhythm ABD:  Soft, nontender, nondistended, normoactive bowel sounds, no abdominal aortic bruit, no hepatomegaly, no splenomegaly MS: nontender back, no kyphosis, no scoliosis, no joint deformities EXT:  2+ DP/PT pulses, no edema, no varicosities, no cyanosis, no clubbing SKIN: warm, nondiaphoretic, normal turgor, no ulcers NEUROPSYCH: alert, oriented to person, place, and time, sensory/motor grossly intact, normal mood, appropriate affect  Recent Labs: 03/29/2015: BUN 15; Creatinine, Ser 0.77; Hemoglobin 13.5; Platelets 185; Potassium 4.9; Sodium 134*; TSH 0.962   Lipid Panel No results found for: CHOL, TRIG, HDL, CHOLHDL, VLDL, LDLCALC, LDLDIRECT   Other studies Reviewed:  EKG:    The ekg from 05/14/2015 was personally reviewed by me and it reveals atrial fibrillation 76 BPM. Nonspecific ST-T wave changes.  EKG from 06/06/2015 showed atrial fibrillation, ventricular rate of 83 BPM, poor R-wave progression.  Additional studies/ records that were reviewed personally reviewed by me today include:  Echocardiogram 05/31/2015: Left ventricle: The cavity size was normal. Systolic function was  normal. The estimated ejection fraction was in the range of 50%  to 55%. Wall motion was normal; there were no regional wall  motion abnormalities. The study is not technically sufficient to   allow evaluation of LV diastolic function. - Mitral valve: There was mild regurgitation. - Left atrium: The atrium was mildly dilated. - Right ventricle: Systolic function was normal. - Tricuspid valve: There was mild-moderate regurgitation. - Pulmonary arteries: Systolic pressure was mild to moderately  elevated. PA peak pressure: 47 mm Hg (S).  Lexiscan stress test 05/18/2015: Myocardial perfusion is normal. The study is normal. This is a low risk study. Overall left ventricular systolic function was normal. The left ventricular ejection fraction is normal (55-65%).   Holter 05/31/2015:  Overall rhythm is atrial fibrillation  Average heart rate of 82 BPM.  Holter monitor from 05/31/2015 showed overall rhythm to be atrial fibrillation. Heart rate ranged from 49-143 bpm, average of 82 BPM.  11% of total number of beats and tachycardia. 1% of total number of beats in bradycardia.  Ventricular ectopy comprised of approximately 3%  of the total number of beats. 3660 were isolated PVCs or aberrantly conducted beats from the atrial fibrillation. 63 ventricular couplets. 6 bigeminal cycles. No ventricular runs.  ASSESSMENT AND PLAN:  Atrial fibrillation, likely permanent. Holter from 05/31/2015 showed atrial fibrillation, average heart rate of 82 BPM. Continue with current dose of metoprolol. Unable to up titrate dose due to borderline blood pressure. Continue oral anticoagulation.. Discussed in detail the importance of oral anticoagulation to reduce her risk of stroke. Discussed importance of using her walker with walking, avoiding working with sharp knives/objects.   Shortness of breath Patient thinks this is from her COPD/emphysema. Patient has risk factors for coronary artery disease.  No ischemia from stress test 05/18/2015. Discussed the findings of this with patient and son. Likelihood of significant CAD is low.  Hypertension Blood pressure within normal limits. Recommend blood  pressure monitoring/BP log. Continue to hold amlodipine for now and continue to monitor blood pressure.   Current medicines are reviewed at length with the patient today.  The patient does not have concerns regarding medicines.  Labs/ tests ordered today include:  Orders Placed This Encounter  Procedures  . EKG 12-Lead    I had a lengthy and detailed discussion with the patient regarding diagnoses, prognosis, diagnostic options, treatment options, and side effects of medications.   I counseled the patient on importance of lifestyle modification including heart healthy diet, regular physical activity  Disposition:   FU with undersigned In 6 months   Signed, Wende Bushy, MD  06/06/2015 10:28 AM    [place]

## 2015-06-06 NOTE — Patient Instructions (Signed)
Medication Instructions:  Your physician recommends that you continue on your current medications as directed. Please refer to the Current Medication list given to you today.   Labwork: None ordered  Testing/Procedures: None ordered  Follow-Up: Your physician wants you to follow-up in: 6 months with Dr. Ingal. You will receive a reminder letter in the mail two months in advance. If you don't receive a letter, please call our office to schedule the follow-up appointment.   Any Other Special Instructions Will Be Listed Below (If Applicable).     If you need a refill on your cardiac medications before your next appointment, please call your pharmacy.   

## 2015-06-18 ENCOUNTER — Other Ambulatory Visit: Payer: Self-pay

## 2015-06-18 MED ORDER — HYDROCODONE-ACETAMINOPHEN 7.5-325 MG PO TABS: 1.0000 | ORAL_TABLET | Freq: Three times a day (TID) | ORAL | Status: DC | PRN

## 2015-06-18 NOTE — Telephone Encounter (Signed)
[name] pts daughter in law ([place] signed) left v/m requesting rx hydrocodone apap. Call when ready for pick up. rx last printed # 90 on 05/16/15.last seen  04/17/15. R Baity N P out of office.

## 2015-06-18 NOTE — Telephone Encounter (Signed)
Called and spoken to patient's daughter in law. Notified her that Rx is ready for pick up and she stated that she will pick up Rx for patient since it hard for patient to come to the office.

## 2015-07-18 ENCOUNTER — Other Ambulatory Visit: Payer: Self-pay | Admitting: Internal Medicine

## 2015-07-18 MED ORDER — HYDROCODONE-ACETAMINOPHEN 7.5-325 MG PO TABS: 1.0000 | ORAL_TABLET | Freq: Three times a day (TID) | ORAL | Status: DC | PRN

## 2015-07-18 NOTE — Telephone Encounter (Signed)
Someone left voiceamil at Triage (no name given), requesting refill for pt. Last refilled on 06/18/15 #90 with 0 refills

## 2015-07-18 NOTE — Telephone Encounter (Signed)
RX printed and signed and placed in [place]. She needs UDS/CSA

## 2015-07-18 NOTE — Telephone Encounter (Signed)
Rx left in front office for pick up and pt is aware  

## 2015-08-14 ENCOUNTER — Other Ambulatory Visit: Payer: Self-pay

## 2015-08-14 MED ORDER — HYDROCODONE-ACETAMINOPHEN 7.5-325 MG PO TABS: 1.0000 | ORAL_TABLET | Freq: Three times a day (TID) | ORAL | Status: DC | PRN

## 2015-08-14 NOTE — Telephone Encounter (Signed)
RX printed and signed and placed in MYD box 

## 2015-08-14 NOTE — Telephone Encounter (Signed)
V/M left requesting rx for hydrocodone apap. Call when ready for pick up. Aware not due until end of week. Last printed # 90 on 07/18/15. Established care on 04/17/15.

## 2015-08-15 ENCOUNTER — Encounter: Payer: Self-pay | Admitting: Internal Medicine

## 2015-08-15 NOTE — Telephone Encounter (Signed)
Daughter in law aware-Rx left in front office for pick up---pt needs to sign CSA and she is aware pt needs to come with her to pick up Rx

## 2015-08-28 DIAGNOSIS — Z79891 Long term (current) use of opiate analgesic: Secondary | ICD-10-CM | POA: Diagnosis not present

## 2015-09-14 ENCOUNTER — Encounter: Payer: Self-pay | Admitting: Internal Medicine

## 2015-09-17 ENCOUNTER — Other Ambulatory Visit: Payer: Self-pay

## 2015-09-17 MED ORDER — HYDROCODONE-ACETAMINOPHEN 7.5-325 MG PO TABS: 1.0000 | ORAL_TABLET | Freq: Three times a day (TID) | ORAL | Status: DC | PRN

## 2015-09-17 NOTE — Telephone Encounter (Signed)
RX printed and signed and placed in MYD box 

## 2015-09-17 NOTE — Telephone Encounter (Signed)
Nanette (D[facility] signed) left v/m requesting rx hydrocodone apap. Call when ready for pick up. Last printed # 90 on 08/14/15. Last seen when established care on 04/17/15.

## 2015-09-18 NOTE — Telephone Encounter (Signed)
Rx left in front office for pick up and Nanette is aware

## 2015-10-12 ENCOUNTER — Other Ambulatory Visit: Payer: Self-pay | Admitting: Internal Medicine

## 2015-10-13 NOTE — Telephone Encounter (Signed)
Last filled 04/2015--please advise if okay to refill

## 2015-10-18 ENCOUNTER — Encounter: Payer: Self-pay | Admitting: Internal Medicine

## 2015-10-18 ENCOUNTER — Ambulatory Visit (INDEPENDENT_AMBULATORY_CARE_PROVIDER_SITE_OTHER): Payer: Medicare Other | Admitting: Internal Medicine

## 2015-10-18 VITALS — BP 112/62 | HR 78 | Temp 98.8°F | Ht <= 58 in | Wt 105.8 lb

## 2015-10-18 DIAGNOSIS — E785 Hyperlipidemia, unspecified: Secondary | ICD-10-CM

## 2015-10-18 DIAGNOSIS — Z23 Encounter for immunization: Secondary | ICD-10-CM | POA: Diagnosis not present

## 2015-10-18 DIAGNOSIS — Z0000 Encounter for general adult medical examination without abnormal findings: Secondary | ICD-10-CM | POA: Diagnosis not present

## 2015-10-18 DIAGNOSIS — Z780 Asymptomatic menopausal state: Secondary | ICD-10-CM | POA: Diagnosis not present

## 2015-10-18 LAB — CBC
HCT: 38.5 % (ref 36.0–46.0)
Hemoglobin: 12.6 g/dL (ref 12.0–15.0)
MCHC: 32.6 g/dL (ref 30.0–36.0)
MCV: 96 fl (ref 78.0–100.0)
Platelets: 193 10*3/uL (ref 150.0–400.0)
RBC: 4.01 Mil/uL (ref 3.87–5.11)
RDW: 14.6 % (ref 11.5–15.5)
WBC: 6.8 10*3/uL (ref 4.0–10.5)

## 2015-10-18 LAB — COMPREHENSIVE METABOLIC PANEL
ALBUMIN: 4 g/dL (ref 3.5–5.2)
ALK PHOS: 68 U/L (ref 39–117)
ALT: 15 U/L (ref 0–35)
AST: 25 U/L (ref 0–37)
BILIRUBIN TOTAL: 0.3 mg/dL (ref 0.2–1.2)
BUN: 23 mg/dL (ref 6–23)
CO2: 29 mEq/L (ref 19–32)
CREATININE: 0.91 mg/dL (ref 0.40–1.20)
Calcium: 10.2 mg/dL (ref 8.4–10.5)
Chloride: 106 mEq/L (ref 96–112)
GFR: 62.6 mL/min (ref >60.00)
Glucose, Bld: 72 mg/dL (ref 70–99)
Potassium: 5 mEq/L (ref 3.5–5.1)
SODIUM: 138 meq/L (ref 135–145)
TOTAL PROTEIN: 6.7 g/dL (ref 6.0–8.3)

## 2015-10-18 LAB — LIPID PANEL
CHOLESTEROL: 142 mg/dL (ref 0–200)
HDL: 46.3 mg/dL (ref >39.00)
LDL Cholesterol: 80 mg/dL (ref 0–99)
NonHDL: 95.52
Total CHOL/HDL Ratio: 3
Triglycerides: 76 mg/dL (ref 0.0–149.0)
VLDL: 15.2 mg/dL (ref 0.0–40.0)

## 2015-10-18 MED ORDER — HYDROCODONE-ACETAMINOPHEN 7.5-325 MG PO TABS: 1.0000 | ORAL_TABLET | Freq: Three times a day (TID) | ORAL | 0 refills | Status: DC | PRN

## 2015-10-18 NOTE — Patient Instructions (Signed)

## 2015-10-18 NOTE — Progress Notes (Signed)
HPI:  Pt presents to the clinic today for her Medicare Wellness Exam.  Past Medical History:  Diagnosis Date  . A-fib ([place])   . Acid reflux   . Allergy   . Arthritis   . Asthma   . Blood transfusion without reported diagnosis   . Depression   . Glaucoma   . History of stomach ulcers   . Hypertension   . Sjoegren syndrome [facility])     Current Outpatient Prescriptions  Medication Sig Dispense Refill  . albuterol (VENTOLIN HFA) 108 (90 Base) MCG/ACT inhaler Inhale 1-2 puffs into the lungs every 6 (six) hours as needed for wheezing or shortness of breath. 1 Inhaler 5  . apixaban (ELIQUIS) 2.5 MG TABS tablet Take 1 tablet (2.5 mg total) by mouth 2 (two) times daily. 180 tablet 1  . brimonidine (ALPHAGAN P) 0.1 % SOLN Apply 1 drop to eye 2 (two) times daily.    . budesonide-formoterol (SYMBICORT) 160-4.5 MCG/ACT inhaler Inhale 2 puffs into the lungs 2 (two) times daily. 1 Inhaler 5  . ferrous sulfate 325 (65 FE) MG tablet Take 325 mg by mouth daily with breakfast.    . HYDROcodone-acetaminophen (NORCO) 7.5-325 MG tablet Take 1 tablet by mouth 3 (three) times daily as needed for moderate pain. Fill on or after 08/18/15 90 tablet 0  . Melatonin 3 MG TABS Take 3 mg by mouth at bedtime.    . metoprolol (LOPRESSOR) 50 MG tablet Take 1 tablet (50 mg total) by mouth 2 (two) times daily. 180 tablet 1  . Multiple Vitamins-Minerals (VITEYES AREDS FORMULA PO) Take 2 tablets by mouth daily.    Marland Kitchen omeprazole (PRILOSEC) 20 MG capsule Take 1 capsule (20 mg total) by mouth 2 (two) times daily before a meal. 180 capsule 1  . PARoxetine (PAXIL) 20 MG tablet TAKE 1 TABLET(20 MG) BY MOUTH DAILY 90 tablet 1  . pilocarpine (SALAGEN) 5 MG tablet TAKE 1 TABLET(5 MG) BY MOUTH TWICE DAILY 180 tablet 0  . potassium chloride (K-DUR,KLOR-CON) 10 MEQ tablet Take 1 tablet (10 mEq total) by mouth daily. 90 tablet 1  . Probiotic Product (PROBIOTIC DAILY PO) Take 1 tablet by mouth daily.     No current facility-administered  medications for this visit.     Allergies  Allergen Reactions  . Penicillins Anaphylaxis and Hives    States "almost died" Has patient had a PCN reaction causing immediate rash, facial/tongue/throat swelling, SOB or lightheadedness with hypotension: Yes Has patient had a PCN reaction causing severe rash involving mucus membranes or skin necrosis: No Has patient had a PCN reaction that required hospitalization No Has patient had a PCN reaction occurring within the last 10 years: No If all of the above answers are "NO", then may proceed with Cephalosporin use.  . Codeine Hives    Family History  Problem Relation Age of Onset  . Diabetes Neg Hx   . Heart disease Mother   . Stroke Mother   . Mental illness Mother   . Emphysema Mother   . Lung cancer Father   . Arthritis Sister   . Alcohol abuse Paternal Aunt   . Alcohol abuse Paternal Uncle     Social History   Social History  . Marital status: Divorced    Spouse name: N/A  . Number of children: N/A  . Years of education: N/A   Occupational History  . Not on file.   Social History Main Topics  . Smoking status: Never Smoker  . Smokeless tobacco:  Never Used  . Alcohol use No  . Drug use: No  . Sexual activity: Not on file   Other Topics Concern  . Not on file   Social History Narrative  . No narrative on file    Hospitiliaztions: None  Health Maintenance:    Flu: 12/2014  Tetanus: unsure  Pneumovax: unsure  Prevnar: unsure  Zostovax: unsure  Pap Smear: unsure   Mammogram: unsure  Colon Screening: never  Vision Screening: 04/2015  Eye  Dentist: as needed   Providers:   PCP: Webb Silversmith, NP-C  Cardiologist: Dr. Yvone Neu    I have personally reviewed and have noted:  1. The patient's medical and social history 2. Their use of alcohol, tobacco or illicit drugs 3. Their current medications and supplements 4. The patient's functional ability including ADL's, fall risks, home safety risks and  hearing or visual impairment. 5. Diet and physical activities 6. Evidence for depression or mood disorder  Subjective:   Review of Systems:   Constitutional: Pt reports fatigue. Denies fever, malaise, headache or abrupt weight changes.  HEENT: Pt reports difficulty hearing. Denies eye pain, eye redness, ear pain, ringing in the ears, wax buildup, runny nose, nasal congestion, bloody nose, or sore throat. Respiratory: Denies difficulty breathing, shortness of breath, cough or sputum production.   Cardiovascular: Denies chest pain, chest tightness, palpitations or swelling in the hands or feet.  Gastrointestinal: Denies abdominal pain, bloating, constipation, diarrhea or blood in the stool.  GU: Denies urgency, frequency, pain with urination, burning sensation, blood in urine, odor or discharge. Musculoskeletal: Pt reports joint pain. Denies decrease in range of motion, difficulty with gait, muscle pain or joint swelling.  Skin: Denies redness, rashes, lesions or ulcercations.  Neurological: Pt reports difficulty remembering things at times and issues with balance. Denies dizziness, difficulty with speech or problems with coordination.  Psych: Denies anxiety, depression, SI/HI.  No other specific complaints in a complete review of systems (except as listed in HPI above).  Objective:  PE:   BP 112/62   Pulse 78   Temp 98.8 F (37.1 C) (Oral)   Ht 4\' 9"  (1.448 m)   Wt 105 lb 12 oz (48 kg)   SpO2 97%   BMI 22.88 kg/m    Wt Readings from Last 3 Encounters:  06/06/15 105 lb 8 oz (47.9 kg)  05/14/15 105 lb 4 oz (47.7 kg)  04/17/15 102 lb (46.3 kg)    General: Appears her stated age, chronically ill appearing in NAD. HEENT:  Ears: Tm's gray and intact, normal light reflex;  Neck: Neck supple, trachea midline. No masses, lumps or thyromegaly present.  Cardiovascular: Normal rate and rhythm. S1,S2 noted.  No murmur, rubs or gallops noted. No JVD or BLE edema.  Pulmonary/Chest:  Normal effort and positive vesicular breath sounds. No respiratory distress. No wheezes, rales or ronchi noted.  Abdomen: Soft and nontender. Normal bowel sounds. No distention or masses noted.  Musculoskeletal: Using a walker for assistance with gait. Neurological: Alert and oriented.  Psychiatric: Mood and affect normal. Behavior is normal. Judgment and thought content normal.    BMET    Component Value Date/Time   NA 134 (L) 03/29/2015 1844   NA 142 06/06/2012 1322   K 4.9 03/29/2015 1844   K 3.5 06/06/2012 1322   CL 106 03/29/2015 1844   CL 114 (H) 06/06/2012 1322   CO2 22 03/29/2015 1844   CO2 21 06/06/2012 1322   GLUCOSE 93 03/29/2015 1844   GLUCOSE 95  06/06/2012 1322   BUN 15 03/29/2015 1844   BUN 14 06/06/2012 1322   CREATININE 0.77 03/29/2015 1844   CREATININE 0.97 06/06/2012 1322   CALCIUM 9.8 03/29/2015 1844   CALCIUM 9.8 06/06/2012 1322   GFRNONAA >60 03/29/2015 1844   GFRNONAA 55 (L) 06/06/2012 1322   GFRAA >60 03/29/2015 1844   GFRAA >60 06/06/2012 1322    Lipid Panel  No results found for: CHOL, TRIG, HDL, CHOLHDL, VLDL, LDLCALC  CBC    Component Value Date/Time   WBC 7.6 03/29/2015 1844   RBC 4.32 03/29/2015 1844   HGB 13.5 03/29/2015 1844   HGB 10.9 (L) 06/06/2012 1322   HCT 41.8 03/29/2015 1844   HCT 33.4 (L) 06/06/2012 1322   PLT 185 03/29/2015 1844   PLT 404 06/06/2012 1322   MCV 96.8 03/29/2015 1844   MCV 82 06/06/2012 1322   MCH 31.2 03/29/2015 1844   MCHC 32.2 03/29/2015 1844   RDW 13.5 03/29/2015 1844   RDW 15.8 (H) 06/06/2012 1322    Hgb A1C No results found for: HGBA1C    Assessment and Plan:   Medicare Annual Wellness Visit:  Diet: She does eat some meat. She loves fruits and veggies. She tries to avoid fried foods. She drinks hot tea and Powerade. Physical activity: Sedentary Depression/mood screen: Negative Hearing: She is having trouble hearing. Will refer to Audiology. Visual acuity: Grossly normal, performs annual eye  exam  ADLs: She can dress herself, bathe herself and feed herself. She makes her own meals. She walks with a walker. Fall risk: High due to balance issues. Home safety: Good Cognitive evaluation: Intact to orientation, naming, recall and repetition EOL planning: Adv directives, full code/ I agree  Preventative Medicine: Encouraged her to get a flu shot in the fall. Prevnar today. Will get Pneumovax in 1 year. She declines Tetanus or Zostovax. She no longer wants to pursue mammogram, pap smear or colon cancer screening. Encouraged her to see an eye doctor and dentist annually. Will check CBC, CMET and Lipid profile today.   Next appointment: 1 year, Medicare Wellness/Follow Up   Webb Silversmith, NP

## 2015-10-20 ENCOUNTER — Other Ambulatory Visit: Payer: Self-pay | Admitting: Internal Medicine

## 2015-10-23 ENCOUNTER — Other Ambulatory Visit: Payer: Self-pay | Admitting: Internal Medicine

## 2015-10-23 NOTE — Telephone Encounter (Signed)
Rx sent through e-scribe  

## 2015-10-23 NOTE — Telephone Encounter (Signed)
V/M left requesting status of omeprazole and eliquis refills requested by [facility]. Refilled omeprazole per protocol. Is it OK to refill eliquis; pt last seen 10/18/15.

## 2015-10-24 NOTE — Addendum Note (Signed)
Addended by: Lurlean Nanny on: 10/24/2015 09:58 AM   Modules accepted: Orders

## 2015-10-31 DIAGNOSIS — H401132 Primary open-angle glaucoma, bilateral, moderate stage: Secondary | ICD-10-CM | POA: Diagnosis not present

## 2015-11-19 ENCOUNTER — Other Ambulatory Visit: Payer: Self-pay

## 2015-11-19 MED ORDER — HYDROCODONE-ACETAMINOPHEN 7.5-325 MG PO TABS: 1.0000 | ORAL_TABLET | Freq: Three times a day (TID) | ORAL | 0 refills | Status: DC | PRN

## 2015-11-19 NOTE — Telephone Encounter (Signed)
Female (no name left) left v/m requesting rx hydrocodone apap. Call when ready for pick up. Pt last seen and rx printed # 90 on 10/18/15.

## 2015-11-19 NOTE — Telephone Encounter (Signed)
Rx left in front office for pick up and pt is aware  

## 2015-11-19 NOTE — Telephone Encounter (Signed)
Nanette called back and left contact #.

## 2015-11-19 NOTE — Telephone Encounter (Signed)
RX printed and signed and placed in MYD box 

## 2015-12-11 DIAGNOSIS — K1120 Sialoadenitis, unspecified: Secondary | ICD-10-CM | POA: Diagnosis not present

## 2015-12-20 ENCOUNTER — Other Ambulatory Visit: Payer: Self-pay | Admitting: Internal Medicine

## 2015-12-20 ENCOUNTER — Telehealth: Payer: Self-pay | Admitting: Internal Medicine

## 2015-12-20 MED ORDER — HYDROCODONE-ACETAMINOPHEN 7.5-325 MG PO TABS: 1.0000 | ORAL_TABLET | Freq: Three times a day (TID) | ORAL | 0 refills | Status: DC | PRN

## 2015-12-20 NOTE — Telephone Encounter (Signed)
Female, I think daughter, left v/m requesting rx hydrocodone apap. Call when ready for pick up. Pt last seen and rx printed # 90 on 11/19/15.  Best number to call is [phone]

## 2015-12-20 NOTE — Telephone Encounter (Signed)
Rx left in front office for pick up and Nanette is aware

## 2015-12-20 NOTE — Telephone Encounter (Signed)
RX printed and signed and placed in MYD box 

## 2015-12-27 DIAGNOSIS — M3501 Sicca syndrome with keratoconjunctivitis: Secondary | ICD-10-CM | POA: Diagnosis not present

## 2015-12-27 DIAGNOSIS — J310 Chronic rhinitis: Secondary | ICD-10-CM | POA: Diagnosis not present

## 2015-12-27 DIAGNOSIS — H903 Sensorineural hearing loss, bilateral: Secondary | ICD-10-CM | POA: Diagnosis not present

## 2016-01-06 ENCOUNTER — Other Ambulatory Visit: Payer: Self-pay | Admitting: Internal Medicine

## 2016-01-07 NOTE — Telephone Encounter (Signed)
Received refill electronically Last refill 10/15/15 #180 Last office visit 10/18/15

## 2016-01-08 NOTE — Telephone Encounter (Signed)
Sent. Thanks.   

## 2016-01-15 ENCOUNTER — Other Ambulatory Visit: Payer: Self-pay

## 2016-01-15 MED ORDER — HYDROCODONE-ACETAMINOPHEN 7.5-325 MG PO TABS: 1.0000 | ORAL_TABLET | Freq: Three times a day (TID) | ORAL | 0 refills | Status: DC | PRN

## 2016-01-15 NOTE — Telephone Encounter (Signed)
V/M left requesting hydrocodone apap. Call when ready for pick up.last printed # 90 on 12/20/15.  seen 10/18/15.

## 2016-01-15 NOTE — Telephone Encounter (Signed)
RX printed and signed and placed in MYD box 

## 2016-01-16 ENCOUNTER — Ambulatory Visit (INDEPENDENT_AMBULATORY_CARE_PROVIDER_SITE_OTHER): Payer: Medicare Other | Admitting: Cardiology

## 2016-01-16 ENCOUNTER — Encounter: Payer: Self-pay | Admitting: Cardiology

## 2016-01-16 VITALS — BP 120/80 | HR 95 | Ht 59.0 in | Wt 112.0 lb

## 2016-01-16 DIAGNOSIS — Z23 Encounter for immunization: Secondary | ICD-10-CM

## 2016-01-16 DIAGNOSIS — I482 Chronic atrial fibrillation: Secondary | ICD-10-CM

## 2016-01-16 DIAGNOSIS — I10 Essential (primary) hypertension: Secondary | ICD-10-CM | POA: Diagnosis not present

## 2016-01-16 DIAGNOSIS — I4821 Permanent atrial fibrillation: Secondary | ICD-10-CM

## 2016-01-16 NOTE — Progress Notes (Signed)
Cardiology Office Note   Date:  01/16/2016   ID:  Peggy Carter, DOB 03-26-1929, MRN [id number]  Referring Doctor:  Webb Silversmith, NP   Cardiologist:   Wende Bushy, MD   Reason for consultation:  Chief Complaint  Patient presents with  . other    6 month follow up. Pt. c/o shortness of breath. Meds reviewed by the pt's med list.       History of Present Illness: Peggy Carter is a 82 y.o. female who presents for Follow-up atrial fibrillation.   In terms of atrial fibrillation, again, she does not report feeling any changes in her rhythm.She does not report any significant bruising or bleeding. No falls.  In terms of the shortness of breath, she continues to have the same symptoms as before no progression.Her shortness of breath is mainly from COPD.  In terms of her hypertension, her blood pressure measurements have been within normal limits and she has been off of the amlodipine since last visit.  Patient denies fever, cough, colds, abdominal pain, orthopnea, PND, edema. She will be getting hearing aids tomorrow.   ROS:  Please see the history of present illness. Aside from mentioned under HPI, all other systems are reviewed and negative.     Past Medical History:  Diagnosis Date  . A-fib ([place])   . Acid reflux   . Allergy   . Arthritis   . Asthma   . Blood transfusion without reported diagnosis   . Depression   . Glaucoma   . History of stomach ulcers   . Hypertension   . Sjoegren syndrome [facility])     Past Surgical History:  Procedure Laterality Date  . ABDOMINAL HYSTERECTOMY    . FOREARM SURGERY Right   . TONSILLECTOMY    . WRIST SURGERY Left      reports that she has never smoked. She has never used smokeless tobacco. She reports that she does not drink alcohol or use drugs.   family history includes Alcohol abuse in her paternal aunt and paternal uncle; Arthritis in her sister; Emphysema in her mother; Heart disease in her mother; Lung cancer  in her father; Mental illness in her mother; Stroke in her mother.   Current Outpatient Prescriptions  Medication Sig Dispense Refill  . albuterol (VENTOLIN HFA) 108 (90 Base) MCG/ACT inhaler Inhale 1-2 puffs into the lungs every 6 (six) hours as needed for wheezing or shortness of breath. 1 Inhaler 5  . brimonidine (ALPHAGAN P) 0.1 % SOLN Apply 1 drop to eye 2 (two) times daily.    Marland Kitchen ELIQUIS 2.5 MG TABS tablet TAKE 1 TABLET(2.5 MG) BY MOUTH TWICE DAILY 180 tablet 1  . ferrous sulfate 325 (65 FE) MG tablet Take 325 mg by mouth daily with breakfast.    . HYDROcodone-acetaminophen (NORCO) 7.5-325 MG tablet Take 1 tablet by mouth 3 (three) times daily as needed for moderate pain. Fill on or after 01/18/16 90 tablet 0  . ipratropium (ATROVENT) 0.06 % nasal spray Place 2 sprays into both nostrils 4 (four) times daily.    . Melatonin 3 MG TABS Take 3 mg by mouth at bedtime.    . metoprolol (LOPRESSOR) 50 MG tablet TAKE 1 TABLET(50 MG) BY MOUTH TWICE DAILY 180 tablet 2  . Multiple Vitamins-Minerals (VITEYES AREDS FORMULA PO) Take 2 tablets by mouth daily.    Marland Kitchen omeprazole (PRILOSEC) 20 MG capsule TAKE 1 CAPSULE(20 MG) BY MOUTH TWICE DAILY BEFORE A MEAL 180 capsule 1  . PARoxetine (PAXIL)  20 MG tablet TAKE 1 TABLET(20 MG) BY MOUTH DAILY 90 tablet 1  . pilocarpine (SALAGEN) 5 MG tablet TAKE 1 TABLET(5 MG) BY MOUTH TWICE DAILY 180 tablet 0  . potassium chloride (K-DUR,KLOR-CON) 10 MEQ tablet Take 1 tablet (10 mEq total) by mouth daily. 90 tablet 1  . Probiotic Product (PROBIOTIC DAILY PO) Take 1 tablet by mouth daily.    . SYMBICORT 160-4.5 MCG/ACT inhaler INHALE 2 PUFFS INTO THE LUNGS TWICE DAILY 10.2 g 2   No current facility-administered medications for this visit.     Allergies: Penicillins and Codeine    PHYSICAL EXAM: VS:  BP 120/80 (BP Location: Left Arm, Patient Position: Sitting, Cuff Size: Normal)   Pulse 95   Ht 4\' 11"  (1.499 m)   Wt 112 lb (50.8 kg)   BMI 22.62 kg/m  , Body mass  index is 22.62 kg/m. Wt Readings from Last 3 Encounters:  01/16/16 112 lb (50.8 kg)  10/18/15 105 lb 12 oz (48 kg)  06/06/15 105 lb 8 oz (47.9 kg)    GENERAL:  well developed, well nourished,  not in acute distress HEENT: normocephalic, pink conjunctivae, anicteric sclerae, no xanthelasma, normal dentition, oropharynx clear NECK:  no neck vein engorgement, JVP normal, no hepatojugular reflux, carotid upstroke brisk and symmetric, no bruit, no thyromegaly, no lymphadenopathy LUNGS:  good respiratory effort, clear to auscultation bilaterally CV:  PMI not displaced, no thrills, no lifts, S2 within normal limits, no palpable S3 or S4, no murmurs, no rubs, no gallops, irregularly irregular rhythm ABD:  Soft, nontender, nondistended, normoactive bowel sounds, no abdominal aortic bruit, no hepatomegaly, no splenomegaly MS: nontender back, no kyphosis, no scoliosis, no joint deformities EXT:  2+ DP/PT pulses, no edema, no varicosities, no cyanosis, no clubbing SKIN: warm, nondiaphoretic, normal turgor, no ulcers NEUROPSYCH: alert, oriented to person, place, and time, sensory/motor grossly intact, normal mood, appropriate affect  Recent Labs: 03/29/2015: TSH 0.962 10/18/2015: ALT 15; BUN 23; Creatinine, Ser 0.91; Hemoglobin 12.6; Platelets 193.0; Potassium 5.0; Sodium 138   Lipid Panel    Component Value Date/Time   CHOL 142 10/18/2015 0858   TRIG 76.0 10/18/2015 0858   HDL 46.30 10/18/2015 0858   CHOLHDL 3 10/18/2015 0858   VLDL 15.2 10/18/2015 0858   LDLCALC 80 10/18/2015 0858     Other studies Reviewed:  EKG:    The ekg from 05/14/2015 was personally reviewed by me and it reveals atrial fibrillation 76 BPM. Nonspecific ST-T wave changes.  EKG from 06/06/2015 showed atrial fibrillation, ventricular rate of 83 BPM, poor R-wave progression.  Additional studies/ records that were reviewed personally reviewed by me today include:  Echocardiogram 05/31/2015: Left ventricle: The cavity  size was normal. Systolic function was  normal. The estimated ejection fraction was in the range of 50%  to 55%. Wall motion was normal; there were no regional wall  motion abnormalities. The study is not technically sufficient to  allow evaluation of LV diastolic function. - Mitral valve: There was mild regurgitation. - Left atrium: The atrium was mildly dilated. - Right ventricle: Systolic function was normal. - Tricuspid valve: There was mild-moderate regurgitation. - Pulmonary arteries: Systolic pressure was mild to moderately  elevated. PA peak pressure: 47 mm Hg (S).  Lexiscan stress test 05/18/2015: Myocardial perfusion is normal. The study is normal. This is a low risk study. Overall left ventricular systolic function was normal. The left ventricular ejection fraction is normal (55-65%).   Holter 05/31/2015:  Overall rhythm is atrial fibrillation  Average  heart rate of 82 BPM.  Holter monitor from 05/31/2015 showed overall rhythm to be atrial fibrillation. Heart rate ranged from 49-143 bpm, average of 82 BPM.  11% of total number of beats and tachycardia. 1% of total number of beats in bradycardia.  Ventricular ectopy comprised of approximately 3% of the total number of beats. 3660 were isolated PVCs or aberrantly conducted beats from the atrial fibrillation. 63 ventricular couplets. 6 bigeminal cycles. No ventricular runs.  ASSESSMENT AND PLAN:  Atrial fibrillation, likely permanent. Holter from 05/31/2015 showed atrial fibrillation, average heart rate of 82 BPM. Continue with current dose of metoprolol.  Continue oral anticoagulation. Discussed in detail the importance of oral anticoagulation to reduce her risk of stroke. Discussed importance of using her walker with walking, avoiding working with sharp knives/objects.  Patient in agreement.  Hypertension Blood pressure within normal limits. Recommend blood pressure monitoring/BP log. Continue to hold amlodipine  for now and continue to monitor blood pressure.   Shortness of breath Patient thinks this is from her COPD/emphysema. Patient has risk factors for coronary artery disease.  No ischemia from stress test 05/18/2015. Likelihood of significant CAD is low.  Current medicines are reviewed at length with the patient today.  The patient does not have concerns regarding medicines.  Labs/ tests ordered today include:  Orders Placed This Encounter  Procedures  . Flu Vaccine QUAD 36+ mos IM  . EKG 12-Lead    I had a lengthy and detailed discussion with the patient regarding diagnoses, prognosis, diagnostic options, treatment options, and side effects of medications.   I counseled the patient on importance of lifestyle modification including heart healthy diet, regular physical activity  Disposition:   FU with undersigned In 6 months   Signed, Wende Bushy, MD  01/16/2016 9:24 AM    [place]

## 2016-01-16 NOTE — Telephone Encounter (Signed)
Called and notified that Rx is ready for pick up.

## 2016-01-16 NOTE — Patient Instructions (Signed)
Follow-Up: Your physician wants you to follow-up in: 6 months with Dr. Ingal. You will receive a reminder letter in the mail two months in advance. If you don't receive a letter, please call our office to schedule the follow-up appointment.  It was a pleasure seeing you today here in the office. Please do not hesitate to give us a call back if you have any further questions. [phone]  Pamela A. RN, BSN    

## 2016-01-27 DIAGNOSIS — J441 Chronic obstructive pulmonary disease with (acute) exacerbation: Secondary | ICD-10-CM | POA: Diagnosis not present

## 2016-02-13 ENCOUNTER — Other Ambulatory Visit: Payer: Self-pay

## 2016-02-13 MED ORDER — HYDROCODONE-ACETAMINOPHEN 7.5-325 MG PO TABS: 1.0000 | ORAL_TABLET | Freq: Three times a day (TID) | ORAL | 0 refills | Status: DC | PRN

## 2016-02-13 NOTE — Telephone Encounter (Signed)
rx printed and signed and placed in MYD box 

## 2016-02-13 NOTE — Telephone Encounter (Signed)
V/m left requesting rx hydrocodone apap. Call when ready for pick up. Last printed # 90 on 01/15/16. Last seen annual 10/18/15.

## 2016-02-14 NOTE — Telephone Encounter (Signed)
Rx left in front office for pick up and Nanette is aware

## 2016-02-16 IMAGING — CT CT MAXILLOFACIAL W/O CM
3 of 4 series · 15 of 47 positions shown, 18 images · non-contrast
Comparison: None.

CLINICAL DATA: Pre articular swelling bilaterally for 3 days, worse
on the left. Initial encounter.

EXAM:
CT MAXILLOFACIAL WITHOUT CONTRAST
TECHNIQUE: Multidetector CT imaging of the maxillofacial structures was
performed. Multiplanar CT image reconstructions were also generated.
A small metallic BB was placed on the right temple in order to
reliably differentiate right from left.

[Series 2: max soft · axial · 0.34mm/px · z∈[-172,-58]mm · 10 of 67 slices shown, 13 images]
[im 5/67  brain]
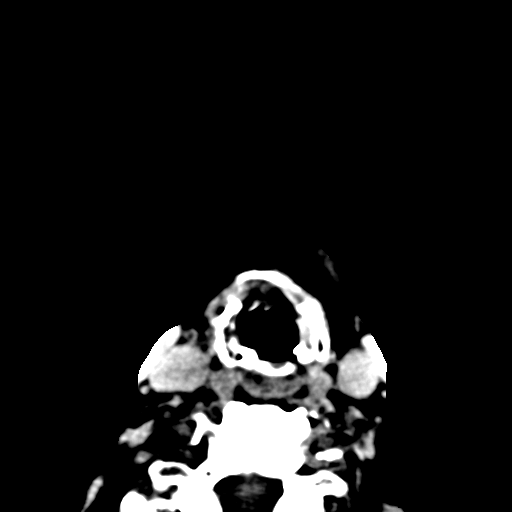
[im 5/67  bone]
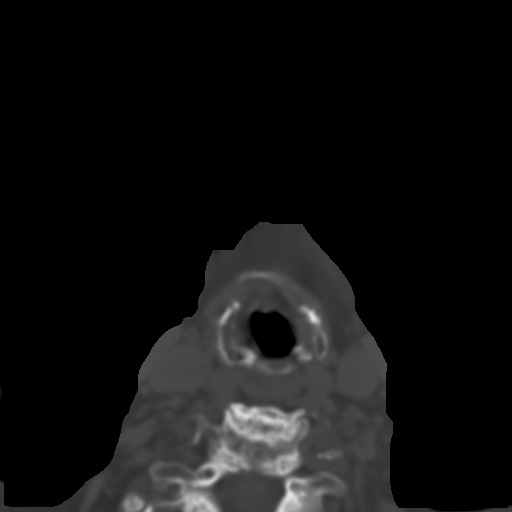
[im 12/67  bone]
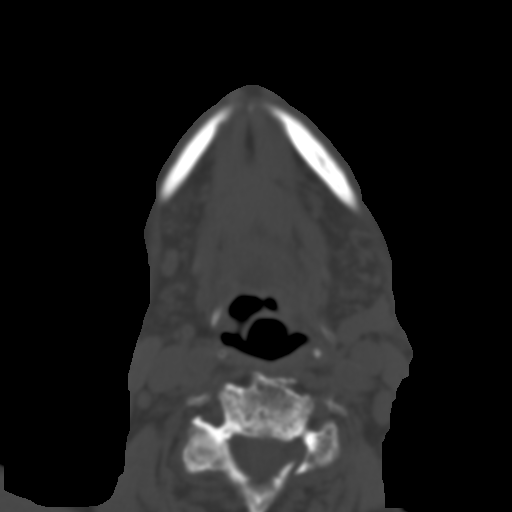
[im 19/67  bone]
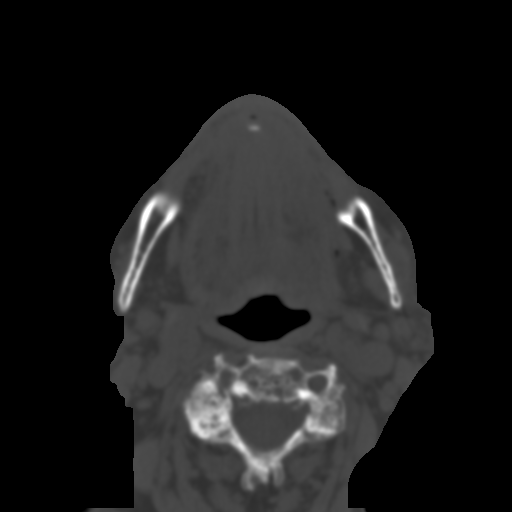
[im 23/67  bone]
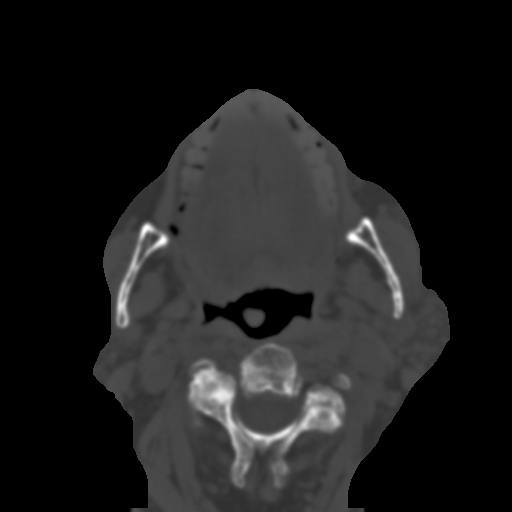
[im 30/67  brain]
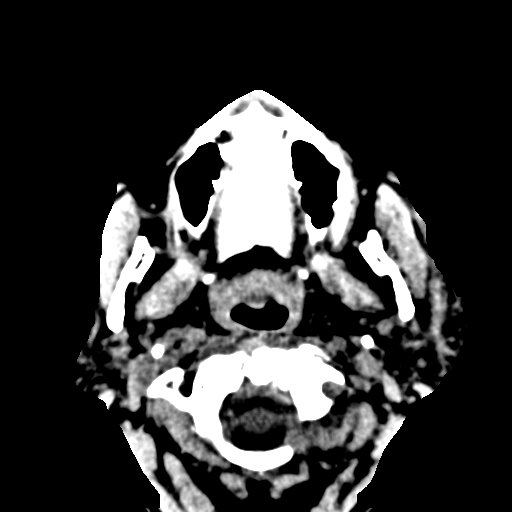
[im 30/67  bone]
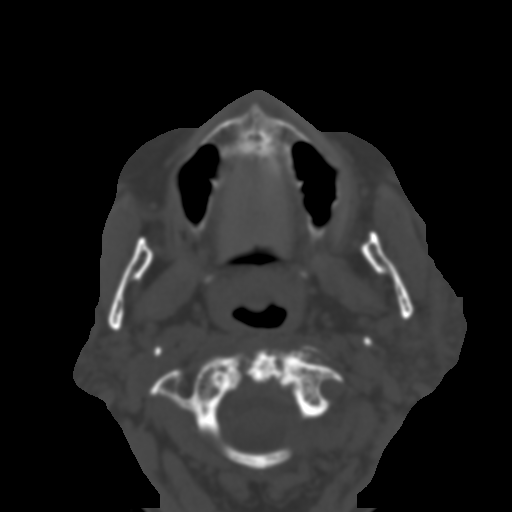
[im 37/67  bone]
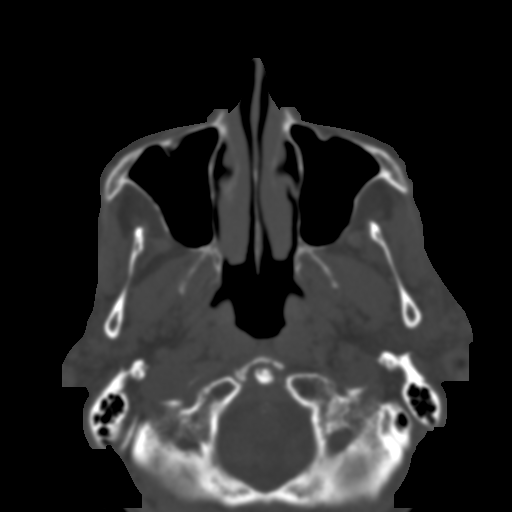
[im 44/67  bone]
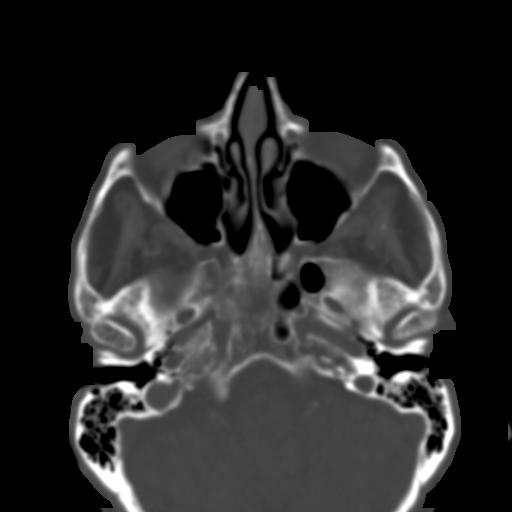
[im 51/67  bone]
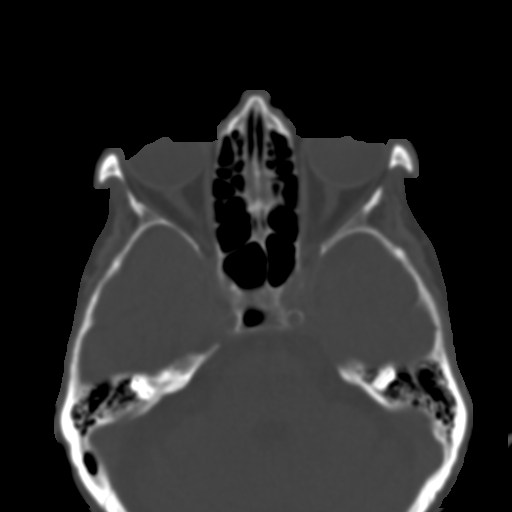
[im 55/67  brain]
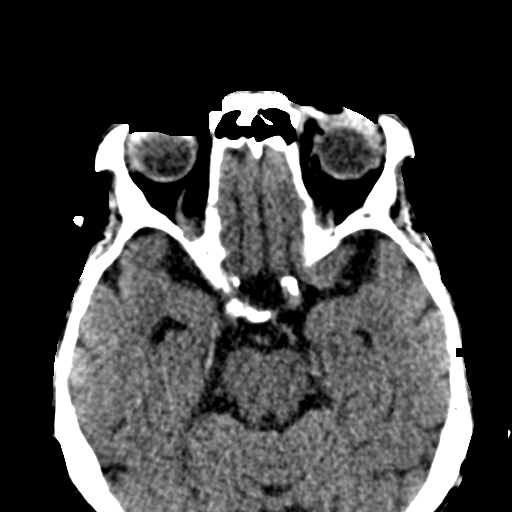
[im 55/67  bone]
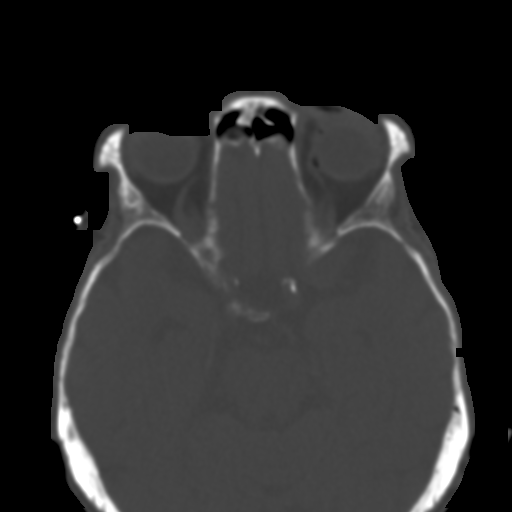
[im 62/67  bone]
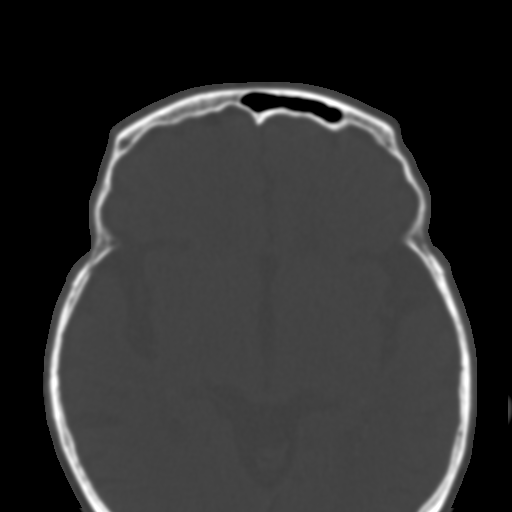

[Series 4: coronal soft · coronal · 0.25mm/px · 3 of 78 slices shown]
[im 26/78  bone]
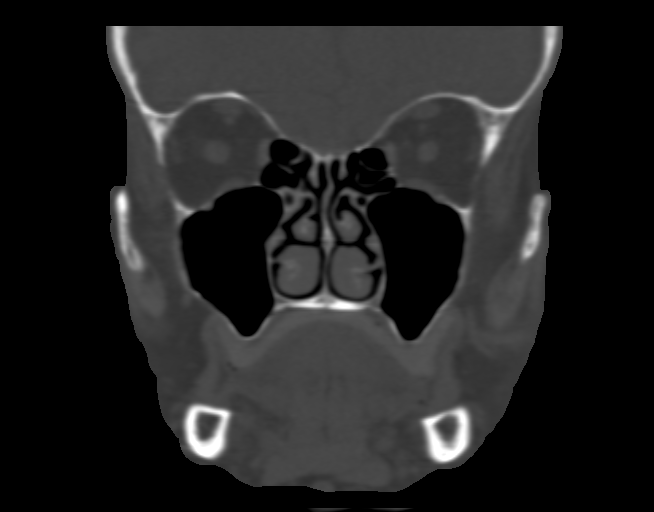
[im 35/78  bone]
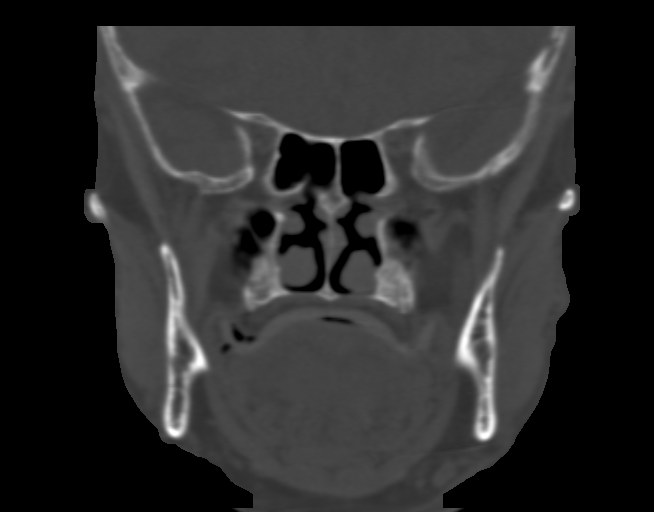
[im 43/78  bone]
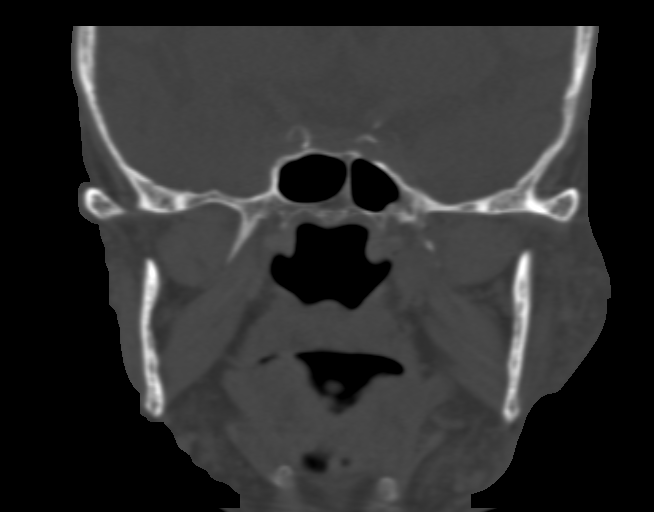

[Series 7: sagittal bone · sagittal · 0.27mm/px · 2 of 75 slices shown]
[im 25/75  bone]
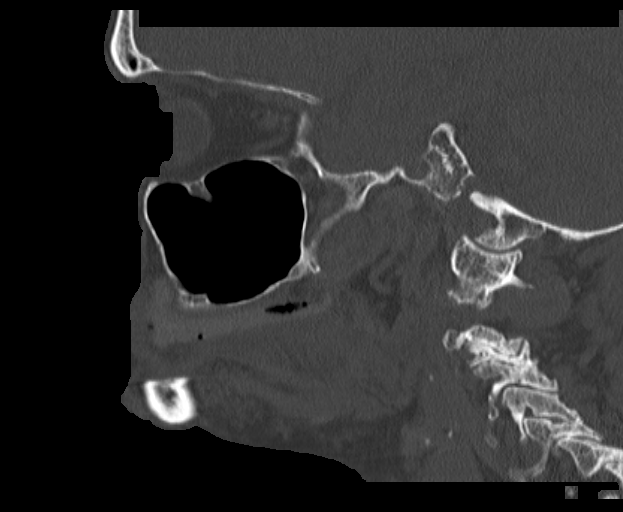
[im 50/75  bone]
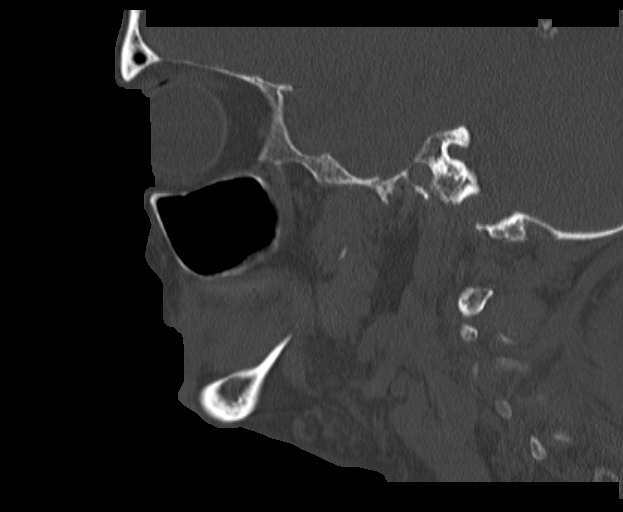

[15 of 47 positions shown; findings below may reference images not displayed]

FINDINGS: A marker is placed in the region of concern and overlies the left
parotid gland. No underlying fluid collection or mass identified.
The left parotid gland is more prominent than the right. No ductal
dilatation or stone is identified. The submandibular glands are
atrophic. No lymphadenopathy or mass is seen. The patient is status
post bilateral lens extraction. Imaged intracranial contents
demonstrate cortical atrophy. Imaged paranasal sinuses are clear.
The patient has marked multilevel cervical spondylosis. No lytic or
sclerotic bony lesion is identified.
IMPRESSION: Although both parotid glands demonstrate fatty atrophy, the left
parotid is more prominent than the right possibly due to infectious
or inflammatory process (parotiditis). No stone or mass is
identified.

Cervical spondylosis.

## 2016-03-06 ENCOUNTER — Other Ambulatory Visit: Payer: Self-pay | Admitting: Internal Medicine

## 2016-03-06 MED ORDER — HYDROCODONE-ACETAMINOPHEN 7.5-325 MG PO TABS: 1.0000 | ORAL_TABLET | Freq: Three times a day (TID) | ORAL | 0 refills | Status: DC | PRN

## 2016-04-09 DIAGNOSIS — K1120 Sialoadenitis, unspecified: Secondary | ICD-10-CM | POA: Diagnosis not present

## 2016-04-12 ENCOUNTER — Other Ambulatory Visit: Payer: Self-pay | Admitting: Internal Medicine

## 2016-04-15 ENCOUNTER — Other Ambulatory Visit: Payer: Self-pay | Admitting: Internal Medicine

## 2016-04-15 MED ORDER — HYDROCODONE-ACETAMINOPHEN 7.5-325 MG PO TABS: 1.0000 | ORAL_TABLET | Freq: Three times a day (TID) | ORAL | 0 refills | Status: DC | PRN

## 2016-04-23 ENCOUNTER — Other Ambulatory Visit: Payer: Self-pay | Admitting: Internal Medicine

## 2016-04-24 ENCOUNTER — Other Ambulatory Visit: Payer: Self-pay | Admitting: Internal Medicine

## 2016-05-06 DIAGNOSIS — R531 Weakness: Secondary | ICD-10-CM | POA: Diagnosis not present

## 2016-05-06 DIAGNOSIS — N179 Acute kidney failure, unspecified: Secondary | ICD-10-CM | POA: Diagnosis not present

## 2016-05-06 DIAGNOSIS — I6789 Other cerebrovascular disease: Secondary | ICD-10-CM | POA: Diagnosis not present

## 2016-05-06 DIAGNOSIS — I4891 Unspecified atrial fibrillation: Secondary | ICD-10-CM | POA: Diagnosis not present

## 2016-05-06 DIAGNOSIS — E43 Unspecified severe protein-calorie malnutrition: Secondary | ICD-10-CM | POA: Diagnosis not present

## 2016-05-06 DIAGNOSIS — H05232 Hemorrhage of left orbit: Secondary | ICD-10-CM | POA: Diagnosis not present

## 2016-05-06 DIAGNOSIS — J439 Emphysema, unspecified: Secondary | ICD-10-CM | POA: Diagnosis not present

## 2016-05-06 DIAGNOSIS — R51 Headache: Secondary | ICD-10-CM | POA: Diagnosis not present

## 2016-05-06 DIAGNOSIS — W19XXXS Unspecified fall, sequela: Secondary | ICD-10-CM | POA: Diagnosis not present

## 2016-05-06 DIAGNOSIS — K589 Irritable bowel syndrome without diarrhea: Secondary | ICD-10-CM | POA: Diagnosis not present

## 2016-05-06 DIAGNOSIS — H409 Unspecified glaucoma: Secondary | ICD-10-CM | POA: Diagnosis not present

## 2016-05-06 DIAGNOSIS — E875 Hyperkalemia: Secondary | ICD-10-CM | POA: Diagnosis not present

## 2016-05-06 DIAGNOSIS — I10 Essential (primary) hypertension: Secondary | ICD-10-CM | POA: Diagnosis not present

## 2016-05-06 DIAGNOSIS — R4182 Altered mental status, unspecified: Secondary | ICD-10-CM | POA: Diagnosis not present

## 2016-05-06 DIAGNOSIS — F4489 Other dissociative and conversion disorders: Secondary | ICD-10-CM | POA: Diagnosis not present

## 2016-05-06 DIAGNOSIS — I481 Persistent atrial fibrillation: Secondary | ICD-10-CM | POA: Diagnosis not present

## 2016-05-06 DIAGNOSIS — S0990XA Unspecified injury of head, initial encounter: Secondary | ICD-10-CM | POA: Diagnosis not present

## 2016-05-06 DIAGNOSIS — R55 Syncope and collapse: Secondary | ICD-10-CM | POA: Diagnosis not present

## 2016-05-06 DIAGNOSIS — M1990 Unspecified osteoarthritis, unspecified site: Secondary | ICD-10-CM | POA: Diagnosis not present

## 2016-05-06 DIAGNOSIS — L89151 Pressure ulcer of sacral region, stage 1: Secondary | ICD-10-CM | POA: Diagnosis not present

## 2016-05-06 DIAGNOSIS — E876 Hypokalemia: Secondary | ICD-10-CM | POA: Diagnosis not present

## 2016-05-06 DIAGNOSIS — B370 Candidal stomatitis: Secondary | ICD-10-CM | POA: Diagnosis not present

## 2016-05-06 DIAGNOSIS — A419 Sepsis, unspecified organism: Secondary | ICD-10-CM | POA: Diagnosis not present

## 2016-05-06 DIAGNOSIS — N289 Disorder of kidney and ureter, unspecified: Secondary | ICD-10-CM | POA: Diagnosis not present

## 2016-05-06 DIAGNOSIS — Z7901 Long term (current) use of anticoagulants: Secondary | ICD-10-CM | POA: Diagnosis not present

## 2016-05-06 DIAGNOSIS — E860 Dehydration: Secondary | ICD-10-CM | POA: Diagnosis not present

## 2016-05-06 LAB — BASIC METABOLIC PANEL
BUN: 29 mg/dL — AB (ref 4–21)
Creatinine: 1.4 mg/dL — AB (ref 0.5–1.1)
Glucose: 102 mg/dL
POTASSIUM: 2.5 mmol/L — AB (ref 3.4–5.3)
SODIUM: 140 mmol/L (ref 137–147)

## 2016-05-06 LAB — HEPATIC FUNCTION PANEL
ALT: 9 U/L (ref 7–35)
AST: 18 U/L (ref 13–35)
Alkaline Phosphatase: 93 U/L (ref 25–125)
BILIRUBIN, TOTAL: 0.4 mg/dL

## 2016-05-06 LAB — CBC AND DIFFERENTIAL
HEMATOCRIT: 36 % (ref 36–46)
Hemoglobin: 12 g/dL (ref 12.0–16.0)
Platelets: 262 10*3/uL (ref 150–399)
WBC: 11.6 10^3/mL

## 2016-05-07 DIAGNOSIS — R2689 Other abnormalities of gait and mobility: Secondary | ICD-10-CM | POA: Diagnosis not present

## 2016-05-07 DIAGNOSIS — M6281 Muscle weakness (generalized): Secondary | ICD-10-CM | POA: Diagnosis not present

## 2016-05-07 DIAGNOSIS — K589 Irritable bowel syndrome without diarrhea: Secondary | ICD-10-CM | POA: Diagnosis not present

## 2016-05-07 DIAGNOSIS — R55 Syncope and collapse: Secondary | ICD-10-CM | POA: Diagnosis present

## 2016-05-07 DIAGNOSIS — N390 Urinary tract infection, site not specified: Secondary | ICD-10-CM | POA: Diagnosis present

## 2016-05-07 DIAGNOSIS — B951 Streptococcus, group B, as the cause of diseases classified elsewhere: Secondary | ICD-10-CM | POA: Diagnosis present

## 2016-05-07 DIAGNOSIS — E875 Hyperkalemia: Secondary | ICD-10-CM | POA: Diagnosis present

## 2016-05-07 DIAGNOSIS — Z7901 Long term (current) use of anticoagulants: Secondary | ICD-10-CM | POA: Diagnosis not present

## 2016-05-07 DIAGNOSIS — L89151 Pressure ulcer of sacral region, stage 1: Secondary | ICD-10-CM | POA: Diagnosis present

## 2016-05-07 DIAGNOSIS — A419 Sepsis, unspecified organism: Secondary | ICD-10-CM | POA: Diagnosis present

## 2016-05-07 DIAGNOSIS — Z681 Body mass index (BMI) 19 or less, adult: Secondary | ICD-10-CM | POA: Diagnosis not present

## 2016-05-07 DIAGNOSIS — R531 Weakness: Secondary | ICD-10-CM | POA: Diagnosis not present

## 2016-05-07 DIAGNOSIS — I959 Hypotension, unspecified: Secondary | ICD-10-CM | POA: Diagnosis not present

## 2016-05-07 DIAGNOSIS — I480 Paroxysmal atrial fibrillation: Secondary | ICD-10-CM | POA: Diagnosis not present

## 2016-05-07 DIAGNOSIS — S0512XA Contusion of eyeball and orbital tissues, left eye, initial encounter: Secondary | ICD-10-CM | POA: Diagnosis present

## 2016-05-07 DIAGNOSIS — E43 Unspecified severe protein-calorie malnutrition: Secondary | ICD-10-CM | POA: Diagnosis present

## 2016-05-07 DIAGNOSIS — I951 Orthostatic hypotension: Secondary | ICD-10-CM | POA: Diagnosis not present

## 2016-05-07 DIAGNOSIS — E876 Hypokalemia: Secondary | ICD-10-CM | POA: Diagnosis not present

## 2016-05-07 DIAGNOSIS — I481 Persistent atrial fibrillation: Secondary | ICD-10-CM | POA: Diagnosis present

## 2016-05-07 DIAGNOSIS — M1380 Other specified arthritis, unspecified site: Secondary | ICD-10-CM | POA: Diagnosis not present

## 2016-05-07 DIAGNOSIS — M25552 Pain in left hip: Secondary | ICD-10-CM | POA: Diagnosis present

## 2016-05-07 DIAGNOSIS — R1311 Dysphagia, oral phase: Secondary | ICD-10-CM | POA: Diagnosis not present

## 2016-05-07 DIAGNOSIS — R488 Other symbolic dysfunctions: Secondary | ICD-10-CM | POA: Diagnosis not present

## 2016-05-07 DIAGNOSIS — Z5189 Encounter for other specified aftercare: Secondary | ICD-10-CM | POA: Diagnosis not present

## 2016-05-07 DIAGNOSIS — I081 Rheumatic disorders of both mitral and tricuspid valves: Secondary | ICD-10-CM | POA: Diagnosis not present

## 2016-05-07 DIAGNOSIS — H409 Unspecified glaucoma: Secondary | ICD-10-CM | POA: Diagnosis present

## 2016-05-07 DIAGNOSIS — H05232 Hemorrhage of left orbit: Secondary | ICD-10-CM | POA: Diagnosis not present

## 2016-05-07 DIAGNOSIS — I4891 Unspecified atrial fibrillation: Secondary | ICD-10-CM | POA: Diagnosis not present

## 2016-05-07 DIAGNOSIS — R627 Adult failure to thrive: Secondary | ICD-10-CM | POA: Diagnosis present

## 2016-05-07 DIAGNOSIS — W19XXXA Unspecified fall, initial encounter: Secondary | ICD-10-CM | POA: Diagnosis not present

## 2016-05-07 DIAGNOSIS — B370 Candidal stomatitis: Secondary | ICD-10-CM | POA: Diagnosis present

## 2016-05-07 DIAGNOSIS — S0990XA Unspecified injury of head, initial encounter: Secondary | ICD-10-CM | POA: Diagnosis not present

## 2016-05-07 DIAGNOSIS — Z515 Encounter for palliative care: Secondary | ICD-10-CM | POA: Diagnosis not present

## 2016-05-07 DIAGNOSIS — Z9181 History of falling: Secondary | ICD-10-CM | POA: Diagnosis not present

## 2016-05-07 DIAGNOSIS — I517 Cardiomegaly: Secondary | ICD-10-CM | POA: Diagnosis not present

## 2016-05-07 DIAGNOSIS — N289 Disorder of kidney and ureter, unspecified: Secondary | ICD-10-CM | POA: Diagnosis not present

## 2016-05-07 DIAGNOSIS — J42 Unspecified chronic bronchitis: Secondary | ICD-10-CM | POA: Diagnosis not present

## 2016-05-07 DIAGNOSIS — M1990 Unspecified osteoarthritis, unspecified site: Secondary | ICD-10-CM | POA: Diagnosis present

## 2016-05-07 DIAGNOSIS — J449 Chronic obstructive pulmonary disease, unspecified: Secondary | ICD-10-CM | POA: Diagnosis present

## 2016-05-14 DIAGNOSIS — J4520 Mild intermittent asthma, uncomplicated: Secondary | ICD-10-CM | POA: Diagnosis not present

## 2016-05-14 DIAGNOSIS — R5381 Other malaise: Secondary | ICD-10-CM | POA: Diagnosis not present

## 2016-05-14 DIAGNOSIS — I2510 Atherosclerotic heart disease of native coronary artery without angina pectoris: Secondary | ICD-10-CM | POA: Diagnosis not present

## 2016-05-14 DIAGNOSIS — R600 Localized edema: Secondary | ICD-10-CM | POA: Diagnosis not present

## 2016-05-14 DIAGNOSIS — R2681 Unsteadiness on feet: Secondary | ICD-10-CM | POA: Diagnosis not present

## 2016-05-14 DIAGNOSIS — F329 Major depressive disorder, single episode, unspecified: Secondary | ICD-10-CM | POA: Diagnosis not present

## 2016-05-14 DIAGNOSIS — H05232 Hemorrhage of left orbit: Secondary | ICD-10-CM | POA: Diagnosis not present

## 2016-05-14 DIAGNOSIS — I10 Essential (primary) hypertension: Secondary | ICD-10-CM | POA: Diagnosis not present

## 2016-05-14 DIAGNOSIS — J45909 Unspecified asthma, uncomplicated: Secondary | ICD-10-CM | POA: Diagnosis not present

## 2016-05-14 DIAGNOSIS — Z9181 History of falling: Secondary | ICD-10-CM | POA: Diagnosis not present

## 2016-05-14 DIAGNOSIS — I4891 Unspecified atrial fibrillation: Secondary | ICD-10-CM | POA: Diagnosis not present

## 2016-05-14 DIAGNOSIS — H409 Unspecified glaucoma: Secondary | ICD-10-CM | POA: Diagnosis not present

## 2016-05-14 DIAGNOSIS — R488 Other symbolic dysfunctions: Secondary | ICD-10-CM | POA: Diagnosis not present

## 2016-05-14 DIAGNOSIS — R399 Unspecified symptoms and signs involving the genitourinary system: Secondary | ICD-10-CM | POA: Diagnosis not present

## 2016-05-14 DIAGNOSIS — J449 Chronic obstructive pulmonary disease, unspecified: Secondary | ICD-10-CM | POA: Diagnosis not present

## 2016-05-14 DIAGNOSIS — I481 Persistent atrial fibrillation: Secondary | ICD-10-CM | POA: Diagnosis not present

## 2016-05-14 DIAGNOSIS — I951 Orthostatic hypotension: Secondary | ICD-10-CM | POA: Diagnosis not present

## 2016-05-14 DIAGNOSIS — N3000 Acute cystitis without hematuria: Secondary | ICD-10-CM | POA: Diagnosis not present

## 2016-05-14 DIAGNOSIS — S51012A Laceration without foreign body of left elbow, initial encounter: Secondary | ICD-10-CM | POA: Diagnosis not present

## 2016-05-14 DIAGNOSIS — D72829 Elevated white blood cell count, unspecified: Secondary | ICD-10-CM | POA: Diagnosis not present

## 2016-05-14 DIAGNOSIS — E440 Moderate protein-calorie malnutrition: Secondary | ICD-10-CM | POA: Diagnosis not present

## 2016-05-14 DIAGNOSIS — I482 Chronic atrial fibrillation: Secondary | ICD-10-CM | POA: Diagnosis not present

## 2016-05-14 DIAGNOSIS — R1311 Dysphagia, oral phase: Secondary | ICD-10-CM | POA: Diagnosis not present

## 2016-05-14 DIAGNOSIS — K219 Gastro-esophageal reflux disease without esophagitis: Secondary | ICD-10-CM | POA: Diagnosis not present

## 2016-05-14 DIAGNOSIS — M1990 Unspecified osteoarthritis, unspecified site: Secondary | ICD-10-CM | POA: Diagnosis not present

## 2016-05-14 DIAGNOSIS — I959 Hypotension, unspecified: Secondary | ICD-10-CM | POA: Diagnosis not present

## 2016-05-14 DIAGNOSIS — Z5189 Encounter for other specified aftercare: Secondary | ICD-10-CM | POA: Diagnosis not present

## 2016-05-14 DIAGNOSIS — M1380 Other specified arthritis, unspecified site: Secondary | ICD-10-CM | POA: Diagnosis not present

## 2016-05-14 DIAGNOSIS — E8809 Other disorders of plasma-protein metabolism, not elsewhere classified: Secondary | ICD-10-CM | POA: Diagnosis not present

## 2016-05-14 DIAGNOSIS — Z7901 Long term (current) use of anticoagulants: Secondary | ICD-10-CM | POA: Diagnosis not present

## 2016-05-14 DIAGNOSIS — R2689 Other abnormalities of gait and mobility: Secondary | ICD-10-CM | POA: Diagnosis not present

## 2016-05-14 DIAGNOSIS — M6281 Muscle weakness (generalized): Secondary | ICD-10-CM | POA: Diagnosis not present

## 2016-05-14 DIAGNOSIS — E876 Hypokalemia: Secondary | ICD-10-CM | POA: Diagnosis not present

## 2016-05-15 ENCOUNTER — Encounter: Payer: Self-pay | Admitting: Internal Medicine

## 2016-05-15 ENCOUNTER — Non-Acute Institutional Stay (SKILLED_NURSING_FACILITY): Payer: Medicare Other | Admitting: Internal Medicine

## 2016-05-15 DIAGNOSIS — J45909 Unspecified asthma, uncomplicated: Secondary | ICD-10-CM

## 2016-05-15 DIAGNOSIS — I4821 Permanent atrial fibrillation: Secondary | ICD-10-CM

## 2016-05-15 DIAGNOSIS — E876 Hypokalemia: Secondary | ICD-10-CM

## 2016-05-15 DIAGNOSIS — F329 Major depressive disorder, single episode, unspecified: Secondary | ICD-10-CM | POA: Diagnosis not present

## 2016-05-15 DIAGNOSIS — R5381 Other malaise: Secondary | ICD-10-CM | POA: Diagnosis not present

## 2016-05-15 DIAGNOSIS — D72829 Elevated white blood cell count, unspecified: Secondary | ICD-10-CM | POA: Diagnosis not present

## 2016-05-15 DIAGNOSIS — I482 Chronic atrial fibrillation: Secondary | ICD-10-CM

## 2016-05-15 DIAGNOSIS — H409 Unspecified glaucoma: Secondary | ICD-10-CM

## 2016-05-15 DIAGNOSIS — F32A Depression, unspecified: Secondary | ICD-10-CM

## 2016-05-15 DIAGNOSIS — I10 Essential (primary) hypertension: Secondary | ICD-10-CM

## 2016-05-15 DIAGNOSIS — I951 Orthostatic hypotension: Secondary | ICD-10-CM | POA: Diagnosis not present

## 2016-05-15 DIAGNOSIS — K219 Gastro-esophageal reflux disease without esophagitis: Secondary | ICD-10-CM

## 2016-05-15 DIAGNOSIS — N3000 Acute cystitis without hematuria: Secondary | ICD-10-CM

## 2016-05-15 NOTE — Progress Notes (Signed)
LOCATION: [facility]  PCP: Webb Silversmith, NP   Code Status: Full Code  Goals of care: Advanced Directive information Advanced Directives 03/29/2015  Does Patient Have a Medical Advance Directive? No       Extended Emergency Contact Information Primary Emergency Contact: Weinand,William F Address: [address]          [place], [place] [address] Johnnette Litter of [place] Phone: [phone] Mobile Phone: [phone] Relation: Son Secondary Emergency Contact: Sawka,Nanette Address: [address]          [place], [place] [address] Johnnette Litter of [place] Phone: [phone] Mobile Phone: [phone] Relation: Relative   Allergies  Allergen Reactions  . Penicillins Anaphylaxis and Hives    States "almost died" Has patient had a PCN reaction causing immediate rash, facial/tongue/throat swelling, SOB or lightheadedness with hypotension: Yes Has patient had a PCN reaction causing severe rash involving mucus membranes or skin necrosis: No Has patient had a PCN reaction that required hospitalization No Has patient had a PCN reaction occurring within the last 10 years: No If all of the above answers are "NO", then may proceed with Cephalosporin use.  . Codeine Hives    Chief Complaint  Patient presents with  . New Admit To SNF    New Admission Visit      HPI:  Patient is a 83 y.o. female seen today for short term rehabilitation post hospital admission from 05/06/16-05/14/16 post syncope and a fall from generalized weakness and poor oral intake. She sustained left peri-orbital hematoma. CT scan face was negative for facial fracture. She was found to be hypotensive and placed on iv fluids. She was diagnosed with streptococcus UTI and was started on antibiotics. She had left hip pain and acute fracture was ruled out. Her antihypertensives were held with low BP. She has PMH of IBS, COPD, chronic afib among others. She is seen in her room today.   Review of Systems:    Constitutional: Negative for fever, chills, diaphoresis.  HENT: Negative for headache, congestion,sore throat, difficulty swallowing. Positive for nasal discharge.   Eyes: Negative for blurred vision, double vision and discharge.  Respiratory: Negative for cough, shortness of breath and wheezing.  on 2 l o2 by [place] Cardiovascular: Negative for chest pain, palpitations, leg swelling.  Gastrointestinal: Negative for heartburn, nausea, vomiting, abdominal pain, loss of appetite. Last bowel movement was a couple days ago. Genitourinary: Negative for dysuria Musculoskeletal: Negative for back pain, fall in the facility. denies pain this visit.  Skin: Negative for itching, rash.  Neurological: Negative for dizziness. Psychiatric/Behavioral: Negative for depression   Past Medical History:  Diagnosis Date  . A-fib ([place])   . Acid reflux   . Allergy   . Arthritis   . Asthma   . Blood transfusion without reported diagnosis   . Depression   . Glaucoma   . History of stomach ulcers   . Hypertension   . Sjoegren syndrome [facility]    Past Surgical History:  Procedure Laterality Date  . ABDOMINAL HYSTERECTOMY    . FOREARM SURGERY Right   . TONSILLECTOMY    . WRIST SURGERY Left    Social History:   reports that she has never smoked. She has never used smokeless tobacco. She reports that she does not drink alcohol or use drugs.  Family History  Problem Relation Age of Onset  . Heart disease Mother   . Stroke Mother   . Mental illness Mother   . Emphysema Mother   .  Lung cancer Father   . Arthritis Sister   . Alcohol abuse Paternal Aunt   . Alcohol abuse Paternal Uncle   . Diabetes Neg Hx     Medications: Allergies as of 05/15/2016      Reactions   Penicillins Anaphylaxis, Hives   States "almost died" Has patient had a PCN reaction causing immediate rash, facial/tongue/throat swelling, SOB or lightheadedness with hypotension: Yes Has patient had a PCN reaction causing severe rash  involving mucus membranes or skin necrosis: No Has patient had a PCN reaction that required hospitalization No Has patient had a PCN reaction occurring within the last 10 years: No If all of the above answers are "NO", then may proceed with Cephalosporin use.   Codeine Hives      Medication List       Accurate as of 05/15/16  2:36 PM. Always use your most recent med list.          albuterol 108 (90 Base) MCG/ACT inhaler Commonly known as:  PROVENTIL HFA;VENTOLIN HFA Inhale 2 puffs into the lungs every 6 (six) hours.   brimonidine 0.1 % Soln Commonly known as:  ALPHAGAN P Apply 2 drops to eye 2 (two) times daily.   ELIQUIS 2.5 MG Tabs tablet Generic drug:  apixaban TAKE 1 TABLET(2.5 MG) BY MOUTH TWICE DAILY   metoprolol tartrate 25 MG tablet Commonly known as:  LOPRESSOR Take 12.5 mg by mouth 2 (two) times daily.   omeprazole 20 MG capsule Commonly known as:  PRILOSEC TAKE 1 CAPSULE(20 MG) BY MOUTH TWICE DAILY BEFORE A MEAL   PARoxetine 10 MG tablet Commonly known as:  PAXIL Take 10 mg by mouth daily.   pilocarpine 5 MG tablet Commonly known as:  SALAGEN TAKE 1 TABLET(5 MG) BY MOUTH TWICE DAILY   potassium chloride 10 MEQ tablet Commonly known as:  K-DUR,KLOR-CON Take 1 tablet (10 mEq total) by mouth daily.   PROBIOTIC DAILY PO Take 1 tablet by mouth daily.   SYMBICORT 160-4.5 MCG/ACT inhaler Generic drug:  budesonide-formoterol INHALE 2 PUFFS INTO THE LUNGS TWICE DAILY       Immunizations: Immunization History  Administered Date(s) Administered  . Influenza,inj,Quad PF,36+ Mos 12/02/2014, 01/16/2016  . PPD Test 05/14/2016  . Pneumococcal Conjugate-13 10/18/2015     Physical Exam:  Vitals:   05/15/16 1426  BP: 116/68  Pulse: 91  Resp: 19  Temp: 97.7 F (36.5 C)  TempSrc: Oral  SpO2: 94%  Weight: 99 lb 11.2 oz (45.2 kg)   Body mass index is 20.14 kg/m.  General- elderly female, frail and thin built, in no acute distress Head-  normocephalic, left periorbital hematoma with resolving bruise, hard of hearing Nose-  no nasal discharge Throat- moist mucus membrane, has denutres Eyes- PERRLA, EOMI, no pallor, no icterus, no discharge, normal conjunctiva, normal sclera Neck- no cervical lymphadenopathy Cardiovascular- irregular heart rate, + systolic murmur Respiratory- bilateral clear to auscultation, no wheeze, no rhonchi, no crackles, no use of accessory muscles, on 2l oxygen by nasal canula Abdomen- bowel sounds present, soft, non tender, no guarding or rigidity Musculoskeletal- able to move all 4 extremities, generalized weakness, no leg edema, limited ROM to shoulders Neurological- alert and oriented to self only Skin- warm and dry, dressing to left elbow Psychiatry- normal mood and affect    Labs reviewed: Basic Metabolic Panel:  Recent Labs  10/18/15 0858 05/06/16  NA 138 140  K 5.0 2.5*  CL 106  --   CO2 29  --   GLUCOSE  72  --   BUN 23 29*  CREATININE 0.91 1.4*  CALCIUM 10.2  --    Liver Function Tests:  Recent Labs  10/18/15 0858 05/06/16  AST 25 18  ALT 15 9  ALKPHOS 68 93  BILITOT 0.3  --   PROT 6.7  --   ALBUMIN 4.0  --    No results for input(s): LIPASE, AMYLASE in the last 8760 hours. No results for input(s): AMMONIA in the last 8760 hours. CBC:  Recent Labs  10/18/15 0858 05/06/16  WBC 6.8 11.6  HGB 12.6 12.0  HCT 38.5 36  MCV 96.0  --   PLT 193.0 262   Cardiac Enzymes: No results for input(s): CKTOTAL, CKMB, CKMBINDEX, TROPONINI in the last 8760 hours. BNP: Invalid input(s): POCBNP CBG: No results for input(s): GLUCAP in the last 8760 hours.  Radiological Exams: Ct Head Wo Iv Contrast 05/06/16 Impression. No acute findings.  Ct Facial Bones Wo Iv Contrast 05/06/16 Impression. No CT evidence of facial injury  Xr Chest Portable 05/06/16 Impression. No active disease detected.  ECG 12 Lead 05/06/16 Impression. Ventricular rate 79 Atrial rate 357 QRS Duration 90  Q-T Interval 342 QTC Calculation 392. T Axis- 12 diagnosis atrial fibrillation ST and T wave abnormality , consider anterior ischemia. Abnormal ECG. When compared with ECG of 05-06-16 nonspecific T wave abnormality has replaced inverted T waves in inferior leads.   Assessment/Plan  Physical deconditioning From weakness. Will have her work with physical therapy and occupational therapy team to help with gait training and muscle strengthening exercises.fall precautions. Skin care. Encourage to be out of bed. She will   Syncope Thought to be from hypotension. Monitor oral intake. Monitor blood pressure. Fall precautions  afib Controlled HR, continue metoprolol tartrate and eliquis for stroke prevention. High fall risk. She is at risk for intracranial bleed. Per d/c summary, discussion with family and daughter has decided to continue eliquis for now until further discussion with cardiology  Hypertension Currently on metoprolol tartrate 12.5 mg bid, monitor BP reading  Hypokalemia On kcl supplement, check bmp  Leukocytosis Afebrile, monitor wbc curve  Glaucoma Continue pilocarpine and alphagan eye drop  Chronic depression Continue paxil current regimen  asthma Breathing stable, continue symbicort and prn ventolin. Continue oxygen by nasal cannula  gerd Stable, continue omeprazole bid  UTI Completed antibiotic course, monitor clinically, maintain hydration and perineal hygiene   Goals of care: short term rehabilitation, possible long term care   Labs/tests ordered: cbc, cmp 05/19/16  Family/ staff Communication: reviewed care plan with patient and nursing supervisor    Blanchie Serve, MD Internal Medicine [place], [place] [address] Cell Phone (Monday-Friday 8 am - 5 pm): [phone] On Call: [phone] and follow prompts after 5 pm and on weekends Office Phone: [phone] Office Fax: [phone]

## 2016-05-19 LAB — BASIC METABOLIC PANEL
BUN: 14 mg/dL (ref 4–21)
CREATININE: 0.9 mg/dL (ref 0.5–1.1)
Glucose: 82 mg/dL
POTASSIUM: 4.2 mmol/L (ref 3.4–5.3)
Sodium: 145 mmol/L (ref 137–147)

## 2016-05-19 LAB — HEPATIC FUNCTION PANEL
ALT: 10 U/L (ref 7–35)
AST: 14 U/L (ref 13–35)
Alkaline Phosphatase: 84 U/L (ref 25–125)
Bilirubin, Total: 0.4 mg/dL

## 2016-05-19 LAB — CBC AND DIFFERENTIAL
HCT: 29 % — AB (ref 36–46)
HEMOGLOBIN: 9.3 g/dL — AB (ref 12.0–16.0)
Platelets: 317 10*3/uL (ref 150–399)
WBC: 5.5 10*3/mL

## 2016-05-20 ENCOUNTER — Encounter: Payer: Self-pay | Admitting: Internal Medicine

## 2016-05-20 ENCOUNTER — Non-Acute Institutional Stay (SKILLED_NURSING_FACILITY): Payer: Medicare Other | Admitting: Family

## 2016-05-20 ENCOUNTER — Encounter: Payer: Self-pay | Admitting: Family

## 2016-05-20 DIAGNOSIS — S51012A Laceration without foreign body of left elbow, initial encounter: Secondary | ICD-10-CM | POA: Diagnosis not present

## 2016-05-20 DIAGNOSIS — R399 Unspecified symptoms and signs involving the genitourinary system: Secondary | ICD-10-CM | POA: Diagnosis not present

## 2016-05-20 DIAGNOSIS — R600 Localized edema: Secondary | ICD-10-CM

## 2016-05-20 DIAGNOSIS — E440 Moderate protein-calorie malnutrition: Secondary | ICD-10-CM

## 2016-05-20 DIAGNOSIS — E8809 Other disorders of plasma-protein metabolism, not elsewhere classified: Secondary | ICD-10-CM | POA: Diagnosis not present

## 2016-05-20 NOTE — Progress Notes (Signed)
Location:  [facility] Room Number: 106 Place of Service:  SNF [phone]) Provider: Keyuna Cuthrell FNP-C  Webb Silversmith, NP  Patient Care Team: Jearld Fenton, NP as PCP - General (Internal Medicine)  Extended Emergency Contact Information Primary Emergency Contact: Wigfall,William F Address: [address]          [place], [place] [address] Johnnette Litter of [place] Phone: [phone] Mobile Phone: [phone] Relation: Son Secondary Emergency Contact: Zagal,Nanette Address: [address]          [place], [place] [address] Johnnette Litter of [place] Phone: [phone] Mobile Phone: [phone] Relation: Relative  Code Status: Full code  Goals of care: Advanced Directive information Advanced Directives 05/20/2016  Does Patient Have a Medical Advance Directive? No  Would patient like information on creating a medical advance directive? No - Patient declined     Chief Complaint  Patient presents with  . Abnormal Lab    HPI:  Pt is a 83 y.o. female seen today at [facility] and rehabilitation for an acute visit for evaluation of evaluation of abnormal lab results. She has a medical history of HTN, Afib,GERD, Arthritis, Depression among other conditions. She is seen in her room today. She complains of burning sensation with urination. She states continues to work well with therapy. She denies any fever, chills , cough or shortness of breath.Her recent lab results showed Hgb 9.3, HCT 29, TP 4.9, ALB 2.44 ( 05/19/2016). She states her appetite has improved. Facility Nurse reports unable to wean off oxygen but sats > 90% on continuous oxygen via [place].    Past Medical History:  Diagnosis Date  . A-fib ([place])   . Acid reflux   . Allergy   . Arthritis   . Asthma   . Blood transfusion without reported diagnosis   . Depression   . Glaucoma   . History of stomach ulcers   . Hypertension   . Sjoegren syndrome [facility])    Past Surgical History:    Procedure Laterality Date  . ABDOMINAL HYSTERECTOMY    . FOREARM SURGERY Right   . TONSILLECTOMY    . WRIST SURGERY Left     Allergies  Allergen Reactions  . Penicillins Anaphylaxis and Hives    States "almost died" Has patient had a PCN reaction causing immediate rash, facial/tongue/throat swelling, SOB or lightheadedness with hypotension: Yes Has patient had a PCN reaction causing severe rash involving mucus membranes or skin necrosis: No Has patient had a PCN reaction that required hospitalization No Has patient had a PCN reaction occurring within the last 10 years: No If all of the above answers are "NO", then may proceed with Cephalosporin use.  . Codeine Hives    Allergies as of 05/20/2016      Reactions   Penicillins Anaphylaxis, Hives   States "almost died" Has patient had a PCN reaction causing immediate rash, facial/tongue/throat swelling, SOB or lightheadedness with hypotension: Yes Has patient had a PCN reaction causing severe rash involving mucus membranes or skin necrosis: No Has patient had a PCN reaction that required hospitalization No Has patient had a PCN reaction occurring within the last 10 years: No If all of the above answers are "NO", then may proceed with Cephalosporin use.   Codeine Hives      Medication List       Accurate as of 05/20/16  1:30 PM. Always use your most recent med list.  albuterol 108 (90 Base) MCG/ACT inhaler Commonly known as:  PROVENTIL HFA;VENTOLIN HFA Inhale 2 puffs into the lungs every 6 (six) hours.   brimonidine 0.1 % Soln Commonly known as:  ALPHAGAN P Apply 2 drops to eye 2 (two) times daily.   ELIQUIS 2.5 MG Tabs tablet Generic drug:  apixaban TAKE 1 TABLET(2.5 MG) BY MOUTH TWICE DAILY   metoprolol tartrate 25 MG tablet Commonly known as:  LOPRESSOR Take 12.5 mg by mouth 2 (two) times daily.   nystatin cream Commonly known as:  MYCOSTATIN Apply 1 application topically 2 (two) times daily.    omeprazole 20 MG capsule Commonly known as:  PRILOSEC TAKE 1 CAPSULE(20 MG) BY MOUTH TWICE DAILY BEFORE A MEAL   PARoxetine 10 MG tablet Commonly known as:  PAXIL Take 10 mg by mouth daily.   pilocarpine 5 MG tablet Commonly known as:  SALAGEN TAKE 1 TABLET(5 MG) BY MOUTH TWICE DAILY   potassium chloride SA 20 MEQ tablet Commonly known as:  K-DUR,KLOR-CON Take 20 mEq by mouth at bedtime.   PROBIOTIC DAILY PO Take 1 tablet by mouth daily.   SYMBICORT 160-4.5 MCG/ACT inhaler Generic drug:  budesonide-formoterol INHALE 2 PUFFS INTO THE LUNGS TWICE DAILY       Review of Systems  Constitutional: Negative for activity change, appetite change, chills, fatigue and fever.  HENT: Negative for congestion, rhinorrhea, sinus pain, sinus pressure, sneezing and sore throat.   Eyes: Negative.   Respiratory: Negative for cough, chest tightness, shortness of breath and wheezing.   Cardiovascular: Positive for leg swelling. Negative for chest pain and palpitations.  Gastrointestinal: Negative for abdominal distention, abdominal pain, constipation, diarrhea, nausea and vomiting.       LBM 05/19/2016   Endocrine: Negative.   Genitourinary: Negative for difficulty urinating, flank pain, frequency and urgency.       Burning sensation with voiding.   Musculoskeletal: Positive for gait problem.  Skin: Negative for color change, pallor and rash.  Neurological: Negative for dizziness, seizures, syncope, light-headedness and headaches.  Hematological: Does not bruise/bleed easily.  Psychiatric/Behavioral: Negative for agitation, confusion, hallucinations and sleep disturbance. The patient is not nervous/anxious.     Immunization History  Administered Date(s) Administered  . Influenza,inj,Quad PF,36+ Mos 12/02/2014, 01/16/2016  . PPD Test 05/14/2016  . Pneumococcal Conjugate-13 10/18/2015   Pertinent  Health Maintenance Due  Topic Date Due  . PNA vac Low Risk Adult (2 of 2 - PPSV23)  10/17/2016  . INFLUENZA VACCINE  Completed  . DEXA SCAN  Completed   Fall Risk  10/18/2015 04/17/2015  Falls in the past year? Yes Yes  Number falls in past yr: 1 2 or more  Injury with Fall? - No  Risk for fall due to : - Impaired balance/gait   Functional Status Survey:    Vitals:   05/20/16 1256  BP: 114/63  Pulse: 96  Resp: 18  Temp: 97.8 F (36.6 C)  TempSrc: Oral  SpO2: 100%  Weight: 99 lb 11.2 oz (45.2 kg)  Height: 4\' 11"  (1.499 m)   Body mass index is 20.14 kg/m. Physical Exam  Constitutional: She is oriented to person, place, and time.  Thin frail elderly in no acute distress   HENT:  Head: Normocephalic.  Mouth/Throat: Oropharynx is clear and moist. No oropharyngeal exudate.  Eyes: Conjunctivae and EOM are normal. Pupils are equal, round, and reactive to light. Right eye exhibits no discharge. Left eye exhibits no discharge. No scleral icterus.  Neck: Normal range of motion. No  JVD present. No thyromegaly present.  Cardiovascular: Intact distal pulses.  Exam reveals no gallop and no friction rub.   Murmur heard. Irregular HR   Pulmonary/Chest: Effort normal and breath sounds normal. No respiratory distress. She has no wheezes. She has no rales.  Abdominal: Soft. Bowel sounds are normal. She exhibits no distension. There is no tenderness. There is no rebound and no guarding.  Musculoskeletal: She exhibits no tenderness or deformity.  Unsteady gait. Bilateral lower extremities trace-1+ edema.   Lymphadenopathy:    She has no cervical adenopathy.  Neurological: She is oriented to person, place, and time.  Skin: Skin is warm and dry. No rash noted. No erythema. No pallor.  Left elbow skin tear wound bed red with sero-sanguinous drainage on old dressing noted  Peri-area skin excoriation    Psychiatric: She has a normal mood and affect.    Labs reviewed:  Recent Labs  10/18/15 0858 05/06/16 05/19/16  NA 138 140 145  K 5.0 2.5* 4.2  CL 106  --   --   CO2 29   --   --   GLUCOSE 72  --   --   BUN 23 29* 14  CREATININE 0.91 1.4* 0.9  CALCIUM 10.2  --   --     Recent Labs  10/18/15 0858 05/06/16 05/19/16  AST 25 18 14   ALT 15 9 10   ALKPHOS 68 93 84  BILITOT 0.3  --   --   PROT 6.7  --   --   ALBUMIN 4.0  --   --     Recent Labs  10/18/15 0858 05/06/16 05/19/16  WBC 6.8 11.6 5.5  HGB 12.6 12.0 9.3*  HCT 38.5 36 29*  MCV 96.0  --   --   PLT 193.0 262 317   Lab Results  Component Value Date   TSH 0.962 03/29/2015   No results found for: HGBA1C Lab Results  Component Value Date   CHOL 142 10/18/2015   HDL 46.30 10/18/2015   LDLCALC 80 10/18/2015   TRIG 76.0 10/18/2015   CHOLHDL 3 10/18/2015   Assessment/Plan 1. Moderate protein-calorie malnutrition (HCC) TP 4.9 ( 05/19/2016).appetite has improved. Will consult Registered Dietician for protein supplements. Continue to monitor CMP  06/04/2016   2. Hypoalbuminemia  ALB 2.44 ( 05/19/2016).RD consult for supplements. Recheck CMP 06/04/2016   3. Skin tear of left elbow without complication Afebrile.No signs of infections. Continue wound care.    4. Localized edema Trace -1+ edema to lower extremities.Nutrition could be a contributory factor.her appetite has improved.No shortness of breath or wheezing. Apply bilateral lower extremities knee high ted hose on in AM and off at bedtime.   5. Lower urinary tract symptoms Afebrile. Reports burning sensation with voiding. Will obtain urine specimen for U/A and C/S rule out UTI. Continue to encourage fluid intake.     Family/ staff Communication: Reviewed plan of care with patient and facility Nurse supervisor  Labs/tests ordered: None   Sandrea Hughs, NP

## 2016-06-03 ENCOUNTER — Encounter: Payer: Self-pay | Admitting: Family

## 2016-06-03 ENCOUNTER — Non-Acute Institutional Stay (SKILLED_NURSING_FACILITY): Payer: Medicare Other | Admitting: Family

## 2016-06-03 DIAGNOSIS — F32A Depression, unspecified: Secondary | ICD-10-CM

## 2016-06-03 DIAGNOSIS — I4821 Permanent atrial fibrillation: Secondary | ICD-10-CM

## 2016-06-03 DIAGNOSIS — K219 Gastro-esophageal reflux disease without esophagitis: Secondary | ICD-10-CM

## 2016-06-03 DIAGNOSIS — M1990 Unspecified osteoarthritis, unspecified site: Secondary | ICD-10-CM | POA: Diagnosis not present

## 2016-06-03 DIAGNOSIS — F329 Major depressive disorder, single episode, unspecified: Secondary | ICD-10-CM

## 2016-06-03 DIAGNOSIS — R2681 Unsteadiness on feet: Secondary | ICD-10-CM | POA: Diagnosis not present

## 2016-06-03 DIAGNOSIS — J4520 Mild intermittent asthma, uncomplicated: Secondary | ICD-10-CM

## 2016-06-03 DIAGNOSIS — I482 Chronic atrial fibrillation: Secondary | ICD-10-CM

## 2016-06-03 DIAGNOSIS — I10 Essential (primary) hypertension: Secondary | ICD-10-CM | POA: Diagnosis not present

## 2016-06-03 NOTE — Progress Notes (Signed)
Location:  [facility] Room Number: 106 Place of Service:  SNF [phone])  Provider: Marlowe Sax FNP-C   PCP: Webb Silversmith, NP Patient Care Team: Jearld Fenton, NP as PCP - General (Internal Medicine)  Extended Emergency Contact Information Primary Emergency Contact: Vey,William F Address: [address]          [place], [place] [address] Johnnette Litter of [place] Phone: [phone] Mobile Phone: [phone] Relation: Son Secondary Emergency Contact: Trant,Nanette Address: [address]          [place], [place] [address] Johnnette Litter of [place] Phone: [phone] Mobile Phone: [phone] Relation: Relative  Code Status: Full code  Goals of care:  Advanced Directive information Advanced Directives 06/03/2016  Does Patient Have a Medical Advance Directive? No  Would patient like information on creating a medical advance directive? No - Patient declined     Allergies  Allergen Reactions  . Penicillins Anaphylaxis and Hives    States "almost died" Has patient had a PCN reaction causing immediate rash, facial/tongue/throat swelling, SOB or lightheadedness with hypotension: Yes Has patient had a PCN reaction causing severe rash involving mucus membranes or skin necrosis: No Has patient had a PCN reaction that required hospitalization No Has patient had a PCN reaction occurring within the last 10 years: No If all of the above answers are "NO", then may proceed with Cephalosporin use.  . Codeine Hives    Chief Complaint  Patient presents with  . Discharge Note    HPI:  83 y.o. female seen today at [facility] for discharge home.She was here for short term rehabilitation for post hospital admission from 05/06/2016-05/14/2016 post syncope and a fall from generalized weakness and poor oral intake. She sustained left peri-orbital hematoma and left hip pain. Acute fracture was ruled out . CT scan face was negative for  facial fracture. She was found to be hypotensive and placed on I.V fluids. She was diagnosed with streptococcus UTI treated with antibiotics.She has a medical history of HTN, Asthma, OA,GERD, depression among other conditions.She is seen in her room today. She denies any acute issues this visit.During her stay here in rehab she complained of burning sensation with urination.urine specimen was collected which showed > 100,000 colonies of E.coli with ESBL negative. She was treated with a seven day course of antibiotics with much improvement.  She has worked well with PT/OT now stable for discharge home.She will be discharged home with Home health PT/OT to continue with ROM, Exercise, Gait stability and muscle strengthening. She does not require any DME has own FWW at home. Home health services will be arranged by facility social worker prior to discharge. Prescription medication will be written x 1 month then patient to follow up with PCP in 1-2 weeks.Facility staff report no new concerns.  Past Medical History:  Diagnosis Date  . A-fib ([facility])   . Acid reflux   . Allergy   . Arthritis   . Asthma   . Blood transfusion without reported diagnosis   . Depression   . Glaucoma   . History of stomach ulcers   . Hypertension   . Sjoegren syndrome [facility])     Past Surgical History:  Procedure Laterality Date  . ABDOMINAL HYSTERECTOMY    . FOREARM SURGERY Right   . TONSILLECTOMY    . WRIST SURGERY Left       reports that she has never smoked. She has never used smokeless tobacco. She reports  that she does not drink alcohol or use drugs. Social History   Social History  . Marital status: Divorced    Spouse name: N/A  . Number of children: N/A  . Years of education: N/A   Occupational History  . Not on file.   Social History Main Topics  . Smoking status: Never Smoker  . Smokeless tobacco: Never Used  . Alcohol use No  . Drug use: No  . Sexual activity: Not on file   Other Topics Concern    . Not on file   Social History Narrative  . No narrative on file    Allergies  Allergen Reactions  . Penicillins Anaphylaxis and Hives    States "almost died" Has patient had a PCN reaction causing immediate rash, facial/tongue/throat swelling, SOB or lightheadedness with hypotension: Yes Has patient had a PCN reaction causing severe rash involving mucus membranes or skin necrosis: No Has patient had a PCN reaction that required hospitalization No Has patient had a PCN reaction occurring within the last 10 years: No If all of the above answers are "NO", then may proceed with Cephalosporin use.  . Codeine Hives    Pertinent  Health Maintenance Due  Topic Date Due  . INFLUENZA VACCINE  10/01/2016  . PNA vac Low Risk Adult (2 of 2 - PPSV23) 10/17/2016  . DEXA SCAN  Completed    Medications: Allergies as of 06/03/2016      Reactions   Penicillins Anaphylaxis, Hives   States "almost died" Has patient had a PCN reaction causing immediate rash, facial/tongue/throat swelling, SOB or lightheadedness with hypotension: Yes Has patient had a PCN reaction causing severe rash involving mucus membranes or skin necrosis: No Has patient had a PCN reaction that required hospitalization No Has patient had a PCN reaction occurring within the last 10 years: No If all of the above answers are "NO", then may proceed with Cephalosporin use.   Codeine Hives      Medication List       Accurate as of 06/03/16  5:05 PM. Always use your most recent med list.          albuterol 108 (90 Base) MCG/ACT inhaler Commonly known as:  PROVENTIL HFA;VENTOLIN HFA Inhale 2 puffs into the lungs every 6 (six) hours.   brimonidine 0.1 % Soln Commonly known as:  ALPHAGAN P Apply 2 drops to eye 2 (two) times daily.   ELIQUIS 2.5 MG Tabs tablet Generic drug:  apixaban TAKE 1 TABLET(2.5 MG) BY MOUTH TWICE DAILY   metoprolol tartrate 25 MG tablet Commonly known as:  LOPRESSOR Take 12.5 mg by mouth 2 (two)  times daily.   nystatin cream Commonly known as:  MYCOSTATIN Apply 1 application topically 2 (two) times daily.   omeprazole 20 MG capsule Commonly known as:  PRILOSEC TAKE 1 CAPSULE(20 MG) BY MOUTH TWICE DAILY BEFORE A MEAL   PARoxetine 10 MG tablet Commonly known as:  PAXIL Take 10 mg by mouth daily.   pilocarpine 5 MG tablet Commonly known as:  SALAGEN TAKE 1 TABLET(5 MG) BY MOUTH TWICE DAILY   potassium chloride SA 20 MEQ tablet Commonly known as:  K-DUR,KLOR-CON Take 20 mEq by mouth at bedtime.   PROBIOTIC DAILY PO Take 1 tablet by mouth daily.   SYMBICORT 160-4.5 MCG/ACT inhaler Generic drug:  budesonide-formoterol INHALE 2 PUFFS INTO THE LUNGS TWICE DAILY   UNABLE TO FIND Med Name: Med Pass 2.0 120 mL by mouth three times daily  Review of Systems  Constitutional: Negative for activity change, appetite change, chills, fatigue and fever.  HENT: Negative for congestion, rhinorrhea, sinus pain, sinus pressure, sneezing and sore throat.   Eyes: Negative.   Respiratory: Negative for cough, chest tightness, shortness of breath and wheezing.   Cardiovascular: Negative for chest pain, palpitations and leg swelling.  Gastrointestinal: Negative for abdominal distention, abdominal pain, constipation, diarrhea, nausea and vomiting.  Endocrine: Negative.   Genitourinary: Negative for difficulty urinating, dysuria, flank pain, frequency and urgency.  Musculoskeletal: Positive for gait problem.  Skin: Negative for color change, pallor and rash.  Neurological: Negative for dizziness, seizures, syncope, light-headedness and headaches.  Hematological: Does not bruise/bleed easily.  Psychiatric/Behavioral: Negative for agitation, confusion, hallucinations and sleep disturbance. The patient is not nervous/anxious.     Vitals:   06/03/16 1028  BP: 110/70  Pulse: 79  Resp: 18  Temp: (!) 96.3 F (35.7 C)  TempSrc: Oral  SpO2: 98%  Weight: 87 lb 6.4 oz (39.6 kg)    Height: 4\' 11"  (1.499 m)   Body mass index is 17.65 kg/m. Physical Exam  Constitutional: She is oriented to person, place, and time. No distress.  Thin frail elderly  HENT:  Head: Normocephalic.  Mouth/Throat: Oropharynx is clear and moist. No oropharyngeal exudate.  Eyes: Conjunctivae and EOM are normal. Pupils are equal, round, and reactive to light. Right eye exhibits no discharge. Left eye exhibits no discharge. No scleral icterus.  Neck: Normal range of motion. No JVD present. No thyromegaly present.  Cardiovascular: Intact distal pulses.  Exam reveals no gallop and no friction rub.   Murmur heard. Irregular HR   Pulmonary/Chest: Effort normal and breath sounds normal. No respiratory distress. She has no wheezes. She has no rales.  Oxygen 2 liters via [place] in place  Abdominal: Soft. Bowel sounds are normal. She exhibits no distension. There is no tenderness. There is no rebound and no guarding.  Musculoskeletal: She exhibits no tenderness or deformity.  Unsteady gait.Trace edema to lower extremities.Ted hose off during visit    Lymphadenopathy:    She has no cervical adenopathy.  Neurological: She is oriented to person, place, and time.  Skin: Skin is warm and dry. No rash noted. No erythema. No pallor.  Psychiatric: She has a normal mood and affect.    Labs reviewed: Basic Metabolic Panel:  Recent Labs  10/18/15 0858 05/06/16 05/19/16  NA 138 140 145  K 5.0 2.5* 4.2  CL 106  --   --   CO2 29  --   --   GLUCOSE 72  --   --   BUN 23 29* 14  CREATININE 0.91 1.4* 0.9  CALCIUM 10.2  --   --    Liver Function Tests:  Recent Labs  10/18/15 0858 05/06/16 05/19/16  AST 25 18 14   ALT 15 9 10   ALKPHOS 68 93 84  BILITOT 0.3  --   --   PROT 6.7  --   --   ALBUMIN 4.0  --   --    CBC:  Recent Labs  10/18/15 0858 05/06/16 05/19/16  WBC 6.8 11.6 5.5  HGB 12.6 12.0 9.3*  HCT 38.5 36 29*  MCV 96.0  --   --   PLT 193.0 262 317   Assessment/Plan:   1. Unsteady gait   Has worked well with PT/ OT. Will discharge home PT/OT to continue with ROM, Exercise, Gait stability and muscle strengthening. No DME required has own FWW. Fall and safety  precautions.   2. Essential hypertension B/p stable.continue on metoprolol 12.5 mg tablet twice daily. BMP in 1-2 weeks with PCP   3. Permanent atrial fibrillation Continue on EliQuis 2.5 mg Tablet twice daily and Metoprolol 12.5 mg tablet twice daily.monitor Hgb. CBC in 1-2 weeks with PCP   4. asthma without complication Breathing stable. Continue on Albuterol and Symbicort. Oxygen 2 Liters via [place].   5. Gastroesophageal reflux disease without esophagitis Stable. Continue on omeprazole.   6. Arthritis Continue on Tylenol as needed.   7. Depression Stable. Continue on Paxil. Continue to monitor for mood stability.   Patient is being discharged with the following home health services:   -PT/OT for ROM, exercise, gait stability and muscle strengthening  Patient is being discharged with the following durable medical equipment:   - None required has own [place]  Patient has been advised to f/u with their PCP in 1-2 weeks to for a transitions of care visit.Social services at their facility was responsible for arranging this appointment.  Pt was provided with adequate prescriptions of noncontrolled medications to reach the scheduled appointment.For controlled substances, a limited supply was provided as appropriate for the individual patient. If the pt normally receives these medications from a pain clinic or has a contract with another physician, these medications should be received from that clinic or physician only).    Future labs/tests needed:  CBC, BMP in 1-2 weeks PCP

## 2016-06-09 DIAGNOSIS — R1312 Dysphagia, oropharyngeal phase: Secondary | ICD-10-CM | POA: Diagnosis not present

## 2016-06-09 DIAGNOSIS — R2689 Other abnormalities of gait and mobility: Secondary | ICD-10-CM | POA: Diagnosis not present

## 2016-06-09 DIAGNOSIS — I951 Orthostatic hypotension: Secondary | ICD-10-CM | POA: Diagnosis not present

## 2016-06-09 DIAGNOSIS — M6281 Muscle weakness (generalized): Secondary | ICD-10-CM | POA: Diagnosis not present

## 2016-06-09 DIAGNOSIS — Z9181 History of falling: Secondary | ICD-10-CM | POA: Diagnosis not present

## 2016-06-09 DIAGNOSIS — Z5189 Encounter for other specified aftercare: Secondary | ICD-10-CM | POA: Diagnosis not present

## 2016-06-09 DIAGNOSIS — R488 Other symbolic dysfunctions: Secondary | ICD-10-CM | POA: Diagnosis not present

## 2016-06-09 DIAGNOSIS — A419 Sepsis, unspecified organism: Secondary | ICD-10-CM | POA: Diagnosis not present

## 2016-06-11 DIAGNOSIS — M6281 Muscle weakness (generalized): Secondary | ICD-10-CM | POA: Diagnosis not present

## 2016-06-11 DIAGNOSIS — R2689 Other abnormalities of gait and mobility: Secondary | ICD-10-CM | POA: Diagnosis not present

## 2016-06-11 DIAGNOSIS — R488 Other symbolic dysfunctions: Secondary | ICD-10-CM | POA: Diagnosis not present

## 2016-06-11 DIAGNOSIS — Z9181 History of falling: Secondary | ICD-10-CM | POA: Diagnosis not present

## 2016-06-11 DIAGNOSIS — I951 Orthostatic hypotension: Secondary | ICD-10-CM | POA: Diagnosis not present

## 2016-06-11 DIAGNOSIS — Z5189 Encounter for other specified aftercare: Secondary | ICD-10-CM | POA: Diagnosis not present

## 2016-06-13 DIAGNOSIS — Z9181 History of falling: Secondary | ICD-10-CM | POA: Diagnosis not present

## 2016-06-13 DIAGNOSIS — M6281 Muscle weakness (generalized): Secondary | ICD-10-CM | POA: Diagnosis not present

## 2016-06-13 DIAGNOSIS — Z5189 Encounter for other specified aftercare: Secondary | ICD-10-CM | POA: Diagnosis not present

## 2016-06-13 DIAGNOSIS — I951 Orthostatic hypotension: Secondary | ICD-10-CM | POA: Diagnosis not present

## 2016-06-13 DIAGNOSIS — R488 Other symbolic dysfunctions: Secondary | ICD-10-CM | POA: Diagnosis not present

## 2016-06-13 DIAGNOSIS — R2689 Other abnormalities of gait and mobility: Secondary | ICD-10-CM | POA: Diagnosis not present

## 2016-06-19 ENCOUNTER — Encounter: Payer: Self-pay | Admitting: Internal Medicine

## 2016-06-19 ENCOUNTER — Ambulatory Visit (INDEPENDENT_AMBULATORY_CARE_PROVIDER_SITE_OTHER): Payer: Medicare Other | Admitting: Internal Medicine

## 2016-06-19 VITALS — BP 106/76 | HR 65 | Temp 96.8°F | Wt 98.8 lb

## 2016-06-19 DIAGNOSIS — N179 Acute kidney failure, unspecified: Secondary | ICD-10-CM | POA: Diagnosis not present

## 2016-06-19 DIAGNOSIS — I9589 Other hypotension: Secondary | ICD-10-CM | POA: Diagnosis not present

## 2016-06-19 DIAGNOSIS — E440 Moderate protein-calorie malnutrition: Secondary | ICD-10-CM | POA: Diagnosis not present

## 2016-06-19 DIAGNOSIS — E876 Hypokalemia: Secondary | ICD-10-CM | POA: Diagnosis not present

## 2016-06-19 DIAGNOSIS — M25512 Pain in left shoulder: Secondary | ICD-10-CM

## 2016-06-19 DIAGNOSIS — M25511 Pain in right shoulder: Secondary | ICD-10-CM | POA: Diagnosis not present

## 2016-06-19 DIAGNOSIS — W19XXXD Unspecified fall, subsequent encounter: Secondary | ICD-10-CM | POA: Diagnosis not present

## 2016-06-19 DIAGNOSIS — R531 Weakness: Secondary | ICD-10-CM

## 2016-06-19 LAB — COMPREHENSIVE METABOLIC PANEL
ALT: 17 U/L (ref 0–35)
AST: 27 U/L (ref 0–37)
Albumin: 4 g/dL (ref 3.5–5.2)
Alkaline Phosphatase: 72 U/L (ref 39–117)
BUN: 42 mg/dL — AB (ref 6–23)
CHLORIDE: 112 meq/L (ref 96–112)
CO2: 22 meq/L (ref 19–32)
Calcium: 10.5 mg/dL (ref 8.4–10.5)
Creatinine, Ser: 1.06 mg/dL (ref 0.40–1.20)
GFR: 52.41 mL/min — ABNORMAL LOW (ref >60.00)
Glucose, Bld: 100 mg/dL — ABNORMAL HIGH (ref 70–99)
Potassium: 5 mEq/L (ref 3.5–5.1)
SODIUM: 138 meq/L (ref 135–145)
Total Bilirubin: 0.3 mg/dL (ref 0.2–1.2)
Total Protein: 7.3 g/dL (ref 6.0–8.3)

## 2016-06-19 LAB — CBC
HCT: 33.8 % — ABNORMAL LOW (ref 36.0–46.0)
Hemoglobin: 10.6 g/dL — ABNORMAL LOW (ref 12.0–15.0)
MCHC: 31.2 g/dL (ref 30.0–36.0)
MCV: 102.5 fl — AB (ref 78.0–100.0)
Platelets: 197 10*3/uL (ref 150.0–400.0)
RBC: 3.3 Mil/uL — ABNORMAL LOW (ref 3.87–5.11)
RDW: 15.9 % — AB (ref 11.5–15.5)
WBC: 5.2 10*3/uL (ref 4.0–10.5)

## 2016-06-19 MED ORDER — BUDESONIDE-FORMOTEROL FUMARATE 160-4.5 MCG/ACT IN AERO: INHALATION_SPRAY | RESPIRATORY_TRACT | 2 refills | Status: DC

## 2016-06-19 MED ORDER — PAROXETINE HCL 10 MG PO TABS: 10.0000 mg | ORAL_TABLET | Freq: Every day | ORAL | 1 refills | Status: DC

## 2016-06-19 MED ORDER — PILOCARPINE HCL 5 MG PO TABS: ORAL_TABLET | ORAL | 1 refills | Status: DC

## 2016-06-19 MED ORDER — TRAMADOL HCL 50 MG PO TABS: 50.0000 mg | ORAL_TABLET | Freq: Three times a day (TID) | ORAL | 0 refills | Status: DC | PRN

## 2016-06-19 MED ORDER — APIXABAN 2.5 MG PO TABS: ORAL_TABLET | ORAL | 1 refills | Status: DC

## 2016-06-19 MED ORDER — METOPROLOL TARTRATE 25 MG PO TABS: 12.5000 mg | ORAL_TABLET | Freq: Two times a day (BID) | ORAL | 1 refills | Status: DC

## 2016-06-19 MED ORDER — OMEPRAZOLE 20 MG PO CPDR: DELAYED_RELEASE_CAPSULE | ORAL | 1 refills | Status: DC

## 2016-06-19 NOTE — Patient Instructions (Signed)
Fall Prevention in the Home Falls can cause injuries. They can happen to people of all ages. There are many things you can do to make your home safe and to help prevent falls. What can I do on the outside of my home?  Regularly fix the edges of walkways and driveways and fix any cracks.  Remove anything that might make you trip as you walk through a door, such as a raised step or threshold.  Trim any bushes or trees on the path to your home.  Use bright outdoor lighting.  Clear any walking paths of anything that might make someone trip, such as rocks or tools.  Regularly check to see if handrails are loose or broken. Make sure that both sides of any steps have handrails.  Any raised decks and porches should have guardrails on the edges.  Have any leaves, snow, or ice cleared regularly.  Use sand or salt on walking paths during winter.  Clean up any spills in your garage right away. This includes oil or grease spills. What can I do in the bathroom?  Use night lights.  Install grab bars by the toilet and in the tub and shower. Do not use towel bars as grab bars.  Use non-skid mats or decals in the tub or shower.  If you need to sit down in the shower, use a plastic, non-slip stool.  Keep the floor dry. Clean up any water that spills on the floor as soon as it happens.  Remove soap buildup in the tub or shower regularly.  Attach bath mats securely with double-sided non-slip rug tape.  Do not have throw rugs and other things on the floor that can make you trip. What can I do in the bedroom?  Use night lights.  Make sure that you have a light by your bed that is easy to reach.  Do not use any sheets or blankets that are too big for your bed. They should not hang down onto the floor.  Have a firm chair that has side arms. You can use this for support while you get dressed.  Do not have throw rugs and other things on the floor that can make you trip. What can I do in the  kitchen?  Clean up any spills right away.  Avoid walking on wet floors.  Keep items that you use a lot in easy-to-reach places.  If you need to reach something above you, use a strong step stool that has a grab bar.  Keep electrical cords out of the way.  Do not use floor polish or wax that makes floors slippery. If you must use wax, use non-skid floor wax.  Do not have throw rugs and other things on the floor that can make you trip. What can I do with my stairs?  Do not leave any items on the stairs.  Make sure that there are handrails on both sides of the stairs and use them. Fix handrails that are broken or loose. Make sure that handrails are as long as the stairways.  Check any carpeting to make sure that it is firmly attached to the stairs. Fix any carpet that is loose or worn.  Avoid having throw rugs at the top or bottom of the stairs. If you do have throw rugs, attach them to the floor with carpet tape.  Make sure that you have a light switch at the top of the stairs and the bottom of the stairs. If you do   not have them, ask someone to add them for you. What else can I do to help prevent falls?  Wear shoes that:  Do not have high heels.  Have rubber bottoms.  Are comfortable and fit you well.  Are closed at the toe. Do not wear sandals.  If you use a stepladder:  Make sure that it is fully opened. Do not climb a closed stepladder.  Make sure that both sides of the stepladder are locked into place.  Ask someone to hold it for you, if possible.  Clearly mark and make sure that you can see:  Any grab bars or handrails.  First and last steps.  Where the edge of each step is.  Use tools that help you move around (mobility aids) if they are needed. These include:  Canes.  Walkers.  Scooters.  Crutches.  Turn on the lights when you go into a dark area. Replace any light bulbs as soon as they burn out.  Set up your furniture so you have a clear path.  Avoid moving your furniture around.  If any of your floors are uneven, fix them.  If there are any pets around you, be aware of where they are.  Review your medicines with your doctor. Some medicines can make you feel dizzy. This can increase your chance of falling. Ask your doctor what other things that you can do to help prevent falls. This information is not intended to replace advice given to you by your health care provider. Make sure you discuss any questions you have with your health care provider. Document Released: 12/14/2008 Document Revised: 07/26/2015 Document Reviewed: 03/24/2014 Elsevier Interactive Patient Education  2017 [employer] Inc.  

## 2016-06-19 NOTE — Progress Notes (Signed)
Subjective:    Patient ID: Peggy Carter, female    DOB: January 08, 1930, 83 y.o.   MRN: [id number]  HPI  Pt presents to the clinic today to follow up [facility]. She was admitted to [facility] after a syncopal episode which resulted in a fall. They felt the fall was related to hypotension secondary to poor oral intake. She was diagnosed with fall, ARF, hypokalemia, malnutrition, hypotension and generalized weakness. CT scan of the face did not show any fractures. Urinalysis was concerning for UTI. She was treated with IV fluids, IV abx, electrolyte replacement. She underwent PT/OT inpatient. Once she was discharged to [facility], she continued PT/OT. She was eventually discharged with home PT/OT. Her niece reports that since she has been home, she has refused to work with PT/OT because she "does not like them". They have been doing strength exercises daily with light weights. Otherwise, she reports her sleep is interrupted by frequent urination at night. Her appetite has improved significantly.She is 0.5 lbs since discharge. Her bowels are moving normally. She feels likes she is regaining her strength. Her biggest concerns at this time is her arthritic pain. She reports she hurts all over, all the time, mainly in her shoulders, hands and knees. She takes Tylenol but reports it does nothing. She was taking Norco in the past which works really well for her, although she is interested in trying something that is not a narcotic to help control her pain.   Review of Systems      Past Medical History:  Diagnosis Date  . A-fib ([place])   . Acid reflux   . Allergy   . Arthritis   . Asthma   . Blood transfusion without reported diagnosis   . Depression   . Glaucoma   . History of stomach ulcers   . Hypertension   . Sjoegren syndrome [facility])     Current Outpatient Prescriptions  Medication Sig Dispense Refill  . albuterol (PROVENTIL HFA;VENTOLIN HFA) 108 (90 Base) MCG/ACT inhaler  Inhale 2 puffs into the lungs every 6 (six) hours.    . brimonidine (ALPHAGAN P) 0.1 % SOLN Apply 2 drops to eye 2 (two) times daily.     Marland Kitchen ELIQUIS 2.5 MG TABS tablet TAKE 1 TABLET(2.5 MG) BY MOUTH TWICE DAILY 180 tablet 1  . metoprolol tartrate (LOPRESSOR) 25 MG tablet Take 12.5 mg by mouth 2 (two) times daily.    Marland Kitchen nystatin cream (MYCOSTATIN) Apply 1 application topically 2 (two) times daily.    Marland Kitchen omeprazole (PRILOSEC) 20 MG capsule TAKE 1 CAPSULE(20 MG) BY MOUTH TWICE DAILY BEFORE A MEAL 180 capsule 0  . PARoxetine (PAXIL) 10 MG tablet Take 10 mg by mouth daily.    . pilocarpine (SALAGEN) 5 MG tablet TAKE 1 TABLET(5 MG) BY MOUTH TWICE DAILY 180 tablet 0  . potassium chloride SA (K-DUR,KLOR-CON) 20 MEQ tablet Take 20 mEq by mouth at bedtime.    . Probiotic Product (PROBIOTIC DAILY PO) Take 1 tablet by mouth daily.    . SYMBICORT 160-4.5 MCG/ACT inhaler INHALE 2 PUFFS INTO THE LUNGS TWICE DAILY 10.2 g 2  . UNABLE TO FIND Med Name: Med Pass 2.0 120 mL by mouth three times daily    . HYDROcodone-acetaminophen (NORCO) 7.5-325 MG tablet      No current facility-administered medications for this visit.     Allergies  Allergen Reactions  . Penicillins Anaphylaxis and Hives    States "almost died" Has patient had a PCN reaction causing  immediate rash, facial/tongue/throat swelling, SOB or lightheadedness with hypotension: Yes Has patient had a PCN reaction causing severe rash involving mucus membranes or skin necrosis: No Has patient had a PCN reaction that required hospitalization No Has patient had a PCN reaction occurring within the last 10 years: No If all of the above answers are "NO", then may proceed with Cephalosporin use.  . Codeine Hives    Family History  Problem Relation Age of Onset  . Heart disease Mother   . Stroke Mother   . Mental illness Mother   . Emphysema Mother   . Lung cancer Father   . Arthritis Sister   . Alcohol abuse Paternal Aunt   . Alcohol abuse Paternal  Uncle   . Diabetes Neg Hx     Social History   Social History  . Marital status: Divorced    Spouse name: N/A  . Number of children: N/A  . Years of education: N/A   Occupational History  . Not on file.   Social History Main Topics  . Smoking status: Never Smoker  . Smokeless tobacco: Never Used  . Alcohol use No  . Drug use: No  . Sexual activity: Not on file   Other Topics Concern  . Not on file   Social History Narrative  . No narrative on file     Constitutional: Pt reports mild fatigue. Denies fever, malaise, headache or abrupt weight changes.  Respiratory: Denies difficulty breathing, shortness of breath, cough or sputum production.   Cardiovascular: Denies chest pain, chest tightness, palpitations or swelling in the hands or feet.  Gastrointestinal: Denies abdominal pain, bloating, constipation, diarrhea or blood in the stool.  GU: Pt reports nocturia. Denies urgency, frequency, pain with urination, burning sensation, blood in urine, odor or discharge. Musculoskeletal: Pt reports joint pains. Denies decrease in range of motion, difficulty with gait, muscle pain or joint swelling.    No other specific complaints in a complete review of systems (except as listed in HPI above).  Objective:   Physical Exam   BP 106/76   Pulse 65   Temp (!) 96.8 F (36 C) (Oral)   Wt 98 lb 12 oz (44.8 kg)   BMI 19.95 kg/m  Wt Readings from Last 3 Encounters:  06/19/16 98 lb 12 oz (44.8 kg)  06/03/16 87 lb 6.4 oz (39.6 kg)  05/20/16 99 lb 11.2 oz (45.2 kg)    General: Appears her stated age, chronically ill appearing, in NAD. Cardiovascular: Normal rate and rhythm. S1,S2 noted.  No murmur, rubs or gallops noted.  Pulmonary/Chest: Normal effort and positive vesicular breath sounds. No respiratory distress. No wheezes, rales or ronchi noted.  Abdomen: Soft and nontender. Normal bowel sounds.  Musculoskeletal: Multiple joint enlargement but no joint swelling noted. Using  walker for assistance with gait.  Neurological: Alert and oriented.  BMET    Component Value Date/Time   NA 145 05/19/2016   NA 142 06/06/2012 1322   K 4.2 05/19/2016   K 3.5 06/06/2012 1322   CL 106 10/18/2015 0858   CL 114 (H) 06/06/2012 1322   CO2 29 10/18/2015 0858   CO2 21 06/06/2012 1322   GLUCOSE 72 10/18/2015 0858   GLUCOSE 95 06/06/2012 1322   BUN 14 05/19/2016   BUN 14 06/06/2012 1322   CREATININE 0.9 05/19/2016   CREATININE 0.91 10/18/2015 0858   CREATININE 0.97 06/06/2012 1322   CALCIUM 10.2 10/18/2015 0858   CALCIUM 9.8 06/06/2012 1322   GFRNONAA >60 03/29/2015 1844  GFRNONAA 55 (L) 06/06/2012 1322   GFRAA >60 03/29/2015 1844   GFRAA >60 06/06/2012 1322    Lipid Panel     Component Value Date/Time   CHOL 142 10/18/2015 0858   TRIG 76.0 10/18/2015 0858   HDL 46.30 10/18/2015 0858   CHOLHDL 3 10/18/2015 0858   VLDL 15.2 10/18/2015 0858   LDLCALC 80 10/18/2015 0858    CBC    Component Value Date/Time   WBC 5.5 05/19/2016   WBC 6.8 10/18/2015 0858   RBC 4.01 10/18/2015 0858   HGB 9.3 (A) 05/19/2016   HGB 10.9 (L) 06/06/2012 1322   HCT 29 (A) 05/19/2016   HCT 33.4 (L) 06/06/2012 1322   PLT 317 05/19/2016   PLT 404 06/06/2012 1322   MCV 96.0 10/18/2015 0858   MCV 82 06/06/2012 1322   MCH 31.2 03/29/2015 1844   MCHC 32.6 10/18/2015 0858   RDW 14.6 10/18/2015 0858   RDW 15.8 (H) 06/06/2012 1322    Hgb A1C No results found for: HGBA1C        Assessment & Plan:   Hospital/Rehab follow up for fall, ARF, hypotension, hypokalemia, generalized weakness and malnutrition:  Hospital/Rehab notes reviewed Advised her if she was going to refuse home PT/OT, she needs to continue strengthening exercise Advised her to try to drink Boost at least once daily CBC and CMET today  Arthritic Pain:  Discussed scheduling Tylenol ER- she reports this doesn't work for her Will try Tramadol, RX provided today  All other medications refilled per pt  request  RTC in 4 months for your Medicare Wellness Exam Webb Silversmith, NP

## 2016-06-27 DIAGNOSIS — Z5189 Encounter for other specified aftercare: Secondary | ICD-10-CM | POA: Diagnosis not present

## 2016-06-27 DIAGNOSIS — Z9181 History of falling: Secondary | ICD-10-CM | POA: Diagnosis not present

## 2016-06-27 DIAGNOSIS — M6281 Muscle weakness (generalized): Secondary | ICD-10-CM | POA: Diagnosis not present

## 2016-06-27 DIAGNOSIS — I951 Orthostatic hypotension: Secondary | ICD-10-CM | POA: Diagnosis not present

## 2016-06-27 DIAGNOSIS — R2689 Other abnormalities of gait and mobility: Secondary | ICD-10-CM | POA: Diagnosis not present

## 2016-06-27 DIAGNOSIS — R488 Other symbolic dysfunctions: Secondary | ICD-10-CM | POA: Diagnosis not present

## 2016-07-14 DIAGNOSIS — H401132 Primary open-angle glaucoma, bilateral, moderate stage: Secondary | ICD-10-CM | POA: Diagnosis not present

## 2016-07-14 DIAGNOSIS — H26499 Other secondary cataract, unspecified eye: Secondary | ICD-10-CM | POA: Diagnosis not present

## 2016-07-16 ENCOUNTER — Other Ambulatory Visit: Payer: Self-pay | Admitting: Internal Medicine

## 2016-07-17 NOTE — Telephone Encounter (Signed)
Last office visit 06/19/2016.  Last refilled 06/19/2016 for #90 with no refills.  Ok to refill?

## 2016-07-17 NOTE — Telephone Encounter (Signed)
Tramadol called into [facility] - [place], [place] Phone: [phone].  Advised to fill on or after May 19 as instructed by R. Baity.

## 2016-07-17 NOTE — Telephone Encounter (Signed)
Ok to phone in tramadol to be filled on or after 5/19

## 2016-07-22 ENCOUNTER — Other Ambulatory Visit: Payer: Self-pay | Admitting: Internal Medicine

## 2016-07-22 MED ORDER — POTASSIUM CHLORIDE ER 10 MEQ PO TBCR: 20.0000 meq | EXTENDED_RELEASE_TABLET | Freq: Every day | ORAL | 2 refills | Status: DC

## 2016-08-06 ENCOUNTER — Encounter: Payer: Self-pay | Admitting: Family Medicine

## 2016-08-06 ENCOUNTER — Ambulatory Visit (INDEPENDENT_AMBULATORY_CARE_PROVIDER_SITE_OTHER): Payer: Medicare Other | Admitting: Family Medicine

## 2016-08-06 DIAGNOSIS — R609 Edema, unspecified: Secondary | ICD-10-CM | POA: Diagnosis not present

## 2016-08-06 DIAGNOSIS — M1990 Unspecified osteoarthritis, unspecified site: Secondary | ICD-10-CM

## 2016-08-06 MED ORDER — AZITHROMYCIN 250 MG PO TABS: ORAL_TABLET | ORAL | 0 refills | Status: DC

## 2016-08-06 MED ORDER — NYSTATIN 100000 UNIT/ML MT SUSP: 5.0000 mL | Freq: Four times a day (QID) | OROMUCOSAL | 0 refills | Status: AC

## 2016-08-06 NOTE — Patient Instructions (Addendum)
Use nystatin for thrush and start the antibiotics today.  If not better soon or if worse then let us know.  Take care.  Glad to see you.

## 2016-08-06 NOTE — Progress Notes (Signed)
R sided jaw pain, swelling. Pain with chewing. ST.  Swallowing okay.  Now with some L sided soreness also. No FCNAVD.    H/o Sjogren's syndrome.   H/o similar in the past, prev treated with zmax with quick improvement.    Had been in rehab/PT after a fall and was off pain meds after that.  PCP rx'd tramadol in the meantime but patient is having more pain and asked about going back on hydrocodone.  I told patient I will check with PCP but I would need to defer her long-term pain management to the primary.  Meds, vitals, and allergies reviewed.   ROS: Per HPI unless specifically indicated in ROS section   Nad, olderly Hard of hearing R parotid swelling noted, with erythema and tenderness, able to open jaw about 50%, ROM limited by pain Thrush in mouth but MMM Neck supple, no LA IRR ctab

## 2016-08-07 DIAGNOSIS — R609 Edema, unspecified: Secondary | ICD-10-CM | POA: Insufficient documentation

## 2016-08-07 NOTE — Assessment & Plan Note (Signed)
Likely complicated by history of Sjogren's syndrome. She had previously improved with Zithromax when she had episodes like this before.  Also with concurrent thrush, though this appears to be an incidental and separate issue.  Discussed with patient about options. Okay for outpatient follow-up. Start Zithromax and start nystatin. Routine cautions given. Update [place] as needed. She agrees.

## 2016-08-07 NOTE — Assessment & Plan Note (Signed)
  Had been in rehab/PT after a fall and was off pain meds after that.  PCP rx'd tramadol in the meantime but patient is having more pain and asked about going back on hydrocodone.  I told patient I will check with PCP but I would need to defer her long-term pain management to the primary.

## 2016-08-12 ENCOUNTER — Telehealth: Payer: Self-pay

## 2016-08-12 NOTE — Telephone Encounter (Signed)
Ok, change sig to 100 mg QH, Quantity 180, 0 refills

## 2016-08-12 NOTE — Telephone Encounter (Signed)
Left message on voicemail.

## 2016-08-12 NOTE — Telephone Encounter (Signed)
Pt daughter, Michela Pitcher, received your msg, called back to say that she understands the dosage change and pt would like to try that. Please call with any questions.

## 2016-08-12 NOTE — Telephone Encounter (Signed)
-----   Message from Jearld Fenton, NP sent at 08/11/2016  8:54 AM EDT ----- Can we try going up on the dose to 100 mg first. I would like her to stay off the Hydrocodone if at all possible. ----- Message ----- From: Lurlean Nanny, CMA Sent: 08/08/2016   1:14 PM To: Jearld Fenton, NP  And pt states it does not help at all with pain ----- Message ----- From: Jearld Fenton, NP Sent: 08/08/2016   8:53 AM To: Lurlean Nanny, CMA  Call pt:  How often has she been  Taking the Tramadol. Does it ease the pain at all? ----- Message ----- From: Tonia Ghent, MD Sent: 08/07/2016   6:48 AM To: Jearld Fenton, NP   Had been in rehab/PT after a fall and was off pain meds after that.  PCP rx'd tramadol in the meantime but patient is having more pain and asked about going back on hydrocodone.  I told patient I will check with PCP but I would need to defer her long-term pain management to the primary.

## 2016-08-13 MED ORDER — TRAMADOL HCL 50 MG PO TABS: 100.0000 mg | ORAL_TABLET | Freq: Three times a day (TID) | ORAL | 0 refills | Status: DC | PRN

## 2016-08-13 NOTE — Addendum Note (Signed)
Addended by: Lurlean Nanny on: 08/13/2016 10:28 AM   Modules accepted: Orders

## 2016-08-13 NOTE — Telephone Encounter (Signed)
Rx called in to pharmacy. 

## 2016-08-20 ENCOUNTER — Ambulatory Visit (INDEPENDENT_AMBULATORY_CARE_PROVIDER_SITE_OTHER): Payer: Medicare Other | Admitting: Family Medicine

## 2016-08-20 ENCOUNTER — Encounter: Payer: Self-pay | Admitting: Family Medicine

## 2016-08-20 ENCOUNTER — Telehealth: Payer: Self-pay

## 2016-08-20 DIAGNOSIS — R609 Edema, unspecified: Secondary | ICD-10-CM | POA: Diagnosis not present

## 2016-08-20 DIAGNOSIS — M1990 Unspecified osteoarthritis, unspecified site: Secondary | ICD-10-CM | POA: Diagnosis not present

## 2016-08-20 NOTE — Progress Notes (Signed)
Done with abx from prev parotid flare.  No ADE on med.  Done with nystatin for thrush with sx resolved.  Parotid swelling got better in the meantime.   Now with return of pain/swelling on the R side yesterday, similar to prev but better today.  No FCNAV.    Higher dose of tramadol hasn't helped a lot but she hasn't been taking TID dosing.  D/w pt.  Tramadol would likely be safer than hydrocodone, d/w pt.  I didn't change her meds.   Meds, vitals, and allergies reviewed.   ROS: Per HPI unless specifically indicated in ROS section   nad ncat MMM OP wnl no thrush No parotid swelling or tenderness or redness B Neck supple, no LA IRR, not tachy

## 2016-08-20 NOTE — Telephone Encounter (Signed)
She would need to be seen. I did not see her for this issue.

## 2016-08-20 NOTE — Telephone Encounter (Signed)
Pt schedule appt with Dr Damita Dunnings again for today

## 2016-08-20 NOTE — Patient Instructions (Signed)
Any sour food or candy may help.   Take care.  Glad to see you.  Update [place] as needed.

## 2016-08-20 NOTE — Telephone Encounter (Signed)
I am writing to you about Peggy Carter birthday 2029-10-26. Her glands are bothering her again and I don't want them to get out of control. Is there any chance that you can call in a prescription for her to start right away.  Thank you

## 2016-08-21 DIAGNOSIS — M810 Age-related osteoporosis without current pathological fracture: Secondary | ICD-10-CM | POA: Diagnosis not present

## 2016-08-21 NOTE — Assessment & Plan Note (Signed)
Recurrent, resolved now, likely due to Sjoegren syndrome, she doesn't drink of water per family report. Encouraged her to drink more water and eat lemon candy occasionally to increase salivation. 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 She does not need antibiotics at this point. Discussed with patient and son they agree.

## 2016-08-21 NOTE — Assessment & Plan Note (Signed)
Encouraged her to try 3 times a day tramadol dosing and then follow-up PCP as needed. I did not change her medications.

## 2016-09-13 ENCOUNTER — Other Ambulatory Visit: Payer: Self-pay | Admitting: Internal Medicine

## 2016-09-15 ENCOUNTER — Other Ambulatory Visit: Payer: Self-pay | Admitting: Internal Medicine

## 2016-09-15 NOTE — Telephone Encounter (Signed)
Last filled 08/13/16... Please advise 

## 2016-09-16 NOTE — Telephone Encounter (Signed)
Rx called in to pharmacy. 

## 2016-09-16 NOTE — Telephone Encounter (Signed)
Ok to phone in Tramadol 

## 2016-09-17 ENCOUNTER — Ambulatory Visit (INDEPENDENT_AMBULATORY_CARE_PROVIDER_SITE_OTHER): Payer: Medicare Other | Admitting: Internal Medicine

## 2016-09-17 ENCOUNTER — Encounter: Payer: Self-pay | Admitting: Internal Medicine

## 2016-09-17 ENCOUNTER — Other Ambulatory Visit
Admission: RE | Admit: 2016-09-17 | Discharge: 2016-09-17 | Disposition: A | Discharge status: Home / Self Care | Payer: Medicare Other | Source: Ambulatory Visit | Attending: Internal Medicine | Admitting: Internal Medicine

## 2016-09-17 VITALS — BP 110/80 | HR 132 | Ht 59.0 in | Wt 100.8 lb

## 2016-09-17 DIAGNOSIS — H26491 Other secondary cataract, right eye: Secondary | ICD-10-CM | POA: Diagnosis not present

## 2016-09-17 DIAGNOSIS — I10 Essential (primary) hypertension: Secondary | ICD-10-CM | POA: Diagnosis not present

## 2016-09-17 DIAGNOSIS — I4891 Unspecified atrial fibrillation: Secondary | ICD-10-CM | POA: Diagnosis not present

## 2016-09-17 DIAGNOSIS — I4821 Permanent atrial fibrillation: Secondary | ICD-10-CM

## 2016-09-17 DIAGNOSIS — I482 Chronic atrial fibrillation: Secondary | ICD-10-CM | POA: Diagnosis not present

## 2016-09-17 DIAGNOSIS — D649 Anemia, unspecified: Secondary | ICD-10-CM

## 2016-09-17 DIAGNOSIS — R0602 Shortness of breath: Secondary | ICD-10-CM | POA: Diagnosis not present

## 2016-09-17 LAB — BASIC METABOLIC PANEL
ANION GAP: 7 (ref 5–15)
BUN: 37 mg/dL — ABNORMAL HIGH (ref 6–20)
CALCIUM: 10.6 mg/dL — AB (ref 8.9–10.3)
CHLORIDE: 106 mmol/L (ref 101–111)
CO2: 25 mmol/L (ref 22–32)
Creatinine, Ser: 1.07 mg/dL — ABNORMAL HIGH (ref 0.44–1.00)
GFR calc non Af Amer: 46 mL/min — ABNORMAL LOW (ref >60)
GFR, EST AFRICAN AMERICAN: 54 mL/min — AB (ref >60)
Glucose, Bld: 81 mg/dL (ref 65–99)
Potassium: 5 mmol/L (ref 3.5–5.1)
SODIUM: 138 mmol/L (ref 135–145)

## 2016-09-17 LAB — CBC WITH DIFFERENTIAL/PLATELET
BASOS ABS: 0.1 10*3/uL (ref 0–0.1)
Basophils Relative: 2 %
EOS ABS: 0 10*3/uL (ref 0–0.7)
Eosinophils Relative: 1 %
HCT: 40.3 % (ref 35.0–47.0)
HEMOGLOBIN: 13.3 g/dL (ref 12.0–16.0)
LYMPHS ABS: 1.1 10*3/uL (ref 1.0–3.6)
Lymphocytes Relative: 27 %
MCH: 31.4 pg (ref 26.0–34.0)
MCHC: 32.9 g/dL (ref 32.0–36.0)
MCV: 95.2 fL (ref 80.0–100.0)
Monocytes Absolute: 0.3 10*3/uL (ref 0.2–0.9)
Monocytes Relative: 8 %
NEUTROS PCT: 62 %
Neutro Abs: 2.6 10*3/uL (ref 1.4–6.5)
Platelets: 169 10*3/uL (ref 150–440)
RBC: 4.23 MIL/uL (ref 3.80–5.20)
RDW: 13.7 % (ref 11.5–14.5)
WBC: 4.1 10*3/uL (ref 3.6–11.0)

## 2016-09-17 LAB — MAGNESIUM: MAGNESIUM: 2.1 mg/dL (ref 1.7–2.4)

## 2016-09-17 MED ORDER — METOPROLOL TARTRATE 25 MG PO TABS: 25.0000 mg | ORAL_TABLET | Freq: Two times a day (BID) | ORAL | 3 refills | Status: DC

## 2016-09-17 NOTE — Progress Notes (Signed)
Follow-up Outpatient Visit Date: 09/17/2016  Primary Care Provider: Jearld Fenton, NP [address] [place] [address]  Chief Complaint: Follow-up shortness of breath and atrial fibrillation  HPI:  Peggy Carter is a 83 y.o. year-old female with history of permanent atrial fibrillation, hypertension, and COPD, who presents for follow-up of shortness of breath and atrial fibrillation. She was previously followed by Dr. Yvone Neu, having last been seen in 01/2016. Today, Peggy Carter is without complaints. She denies chest pain, shortness of breath, palpitations lightheadedness, orthopnea, PND, and edema. Unfortunately, she has fallen at least twice since her last visit in our office, once while staying with her daughter in [place]. Peggy Carter does not recall the circumstances around her falls, stating only that she found herself on the ground and does not know how she got there. This led to hospitalization and subsequent rehabilitation stay. Recently, she also fell out of bed. She remains on apixaban and has not had any significant bleeding. She reports being compliant with her medications.  --------------------------------------------------------------------------------------------------  Cardiovascular History & Procedures: Cardiovascular Problems:  Permanent atrial fibrillation  Risk Factors:  Hypertension and age > 61  Cath/PCI:  None  CV Surgery:  None  EP Procedures and Devices:  Holter monitor (05/31/15): Atrial fibrillation with average rate of 82 bpm (range 49-1 43 bpm). Occasional PVCs and or aberrancy.  Non-Invasive Evaluation(s):  TTE (05/31/15): Normal LV size and function with EF of 50-55% and normal wall motion. Mild MR. Mild left atrial enlargement. Normal RV size and function. Mild to moderate TR with mild to moderate pulmonary hypertension.  Pharmacologic MPI (05/18/15): Low risk study without ischemia or scar. LVEF 55-65%.  Recent CV Pertinent  Labs: Lab Results  Component Value Date   CHOL 142 10/18/2015   HDL 46.30 10/18/2015   LDLCALC 80 10/18/2015   TRIG 76.0 10/18/2015   CHOLHDL 3 10/18/2015   INR 1.21 03/29/2015   K 5.0 06/19/2016   K 3.5 06/06/2012   BUN 42 (H) 06/19/2016   BUN 14 05/19/2016   BUN 14 06/06/2012   CREATININE 1.06 06/19/2016   CREATININE 0.97 06/06/2012    Past medical and surgical history were reviewed and updated in EPIC.  Current Meds  Medication Sig  . albuterol (PROVENTIL HFA;VENTOLIN HFA) 108 (90 Base) MCG/ACT inhaler Inhale 2 puffs into the lungs every 6 (six) hours.  Marland Kitchen apixaban (ELIQUIS) 2.5 MG TABS tablet TAKE 1 TABLET(2.5 MG) BY MOUTH TWICE DAILY  . brimonidine (ALPHAGAN P) 0.1 % SOLN Apply 2 drops to eye 2 (two) times daily.   . budesonide-formoterol (SYMBICORT) 160-4.5 MCG/ACT inhaler INHALE 2 PUFFS INTO THE LUNGS TWICE DAILY  . metoprolol tartrate (LOPRESSOR) 25 MG tablet Take 0.5 tablets (12.5 mg total) by mouth 2 (two) times daily.  Marland Kitchen nystatin (MYCOSTATIN) 100000 UNIT/ML suspension Take 5 mLs (500,000 Units total) by mouth 4 (four) times daily.  Marland Kitchen nystatin cream (MYCOSTATIN) Apply 1 application topically 2 (two) times daily.  Marland Kitchen omeprazole (PRILOSEC) 20 MG capsule TAKE 1 CAPSULE(20 MG) BY MOUTH TWICE DAILY BEFORE A MEAL  . PARoxetine (PAXIL) 10 MG tablet Take 1 tablet (10 mg total) by mouth daily.  . pilocarpine (SALAGEN) 5 MG tablet TAKE 1 TABLET(5 MG) BY MOUTH TWICE DAILY  . potassium chloride (K-DUR) 10 MEQ tablet Take 2 tablets (20 mEq total) by mouth daily.  . Probiotic Product (PROBIOTIC DAILY PO) Take 1 tablet by mouth daily.  . traMADol (ULTRAM) 50 MG tablet TAKE 2 TABLETS BY MOUTH THREE TIMES  DAILY AS NEEDED  . UNABLE TO FIND Med Name: Med Pass 2.0 120 mL by mouth three times daily    Allergies: Penicillins and Codeine  Social History   Social History  . Marital status: Divorced    Spouse name: N/A  . Number of children: N/A  . Years of education: N/A    Occupational History  . Not on file.   Social History Main Topics  . Smoking status: Never Smoker  . Smokeless tobacco: Never Used  . Alcohol use No  . Drug use: No  . Sexual activity: Not on file   Other Topics Concern  . Not on file   Social History Narrative  . No narrative on file    Family History  Problem Relation Age of Onset  . Heart disease Mother   . Stroke Mother   . Mental illness Mother   . Emphysema Mother   . Lung cancer Father   . Arthritis Sister   . Alcohol abuse Paternal Aunt   . Alcohol abuse Paternal Uncle   . Diabetes Neg Hx     Review of Systems: A 12-system review of systems was performed and was negative except as noted in the HPI.  --------------------------------------------------------------------------------------------------  Physical Exam: BP 110/80 (BP Location: Left Arm, Patient Position: Sitting, Cuff Size: Normal)   Pulse (!) 132   Ht 4\' 11"  (1.499 m)   Wt 100 lb 12 oz (45.7 kg)   BMI 20.35 kg/m   Repeat pulse: 100 bpm  General:  Frail, elderly woman seated on the exam table. She is accompanied by her son. HEENT: No conjunctival pallor or scleral icterus. Moist mucous membranes.  OP clear. Neck: Supple without lymphadenopathy, thyromegaly, JVD, or HJR. No carotid bruit. Lungs: Normal work of breathing. Clear to auscultation bilaterally without wheezes or crackles. Heart: Tachycardic and irregularly irregular without murmurs. Non-displaced PMI. Abd: Bowel sounds present. Soft, NT/ND without hepatosplenomegaly Ext: Trace ankle edema. Radial, PT, and DP pulses are 2+ bilaterally. Skin: Warm and dry without rash.  EKG:  Atrial fibrillation with rapid ventricular response (ventricular rate 132 bpm) with nonspecific ST/T changes.  Lab Results  Component Value Date   WBC 5.2 06/19/2016   HGB 10.6 (L) 06/19/2016   HCT 33.8 (L) 06/19/2016   MCV 102.5 (H) 06/19/2016   PLT 197.0 06/19/2016    Lab Results  Component Value  Date   NA 138 06/19/2016   K 5.0 06/19/2016   CL 112 06/19/2016   CO2 22 06/19/2016   BUN 42 (H) 06/19/2016   CREATININE 1.06 06/19/2016   GLUCOSE 100 (H) 06/19/2016   ALT 17 06/19/2016    Lab Results  Component Value Date   CHOL 142 10/18/2015   HDL 46.30 10/18/2015   LDLCALC 80 10/18/2015   TRIG 76.0 10/18/2015   CHOLHDL 3 10/18/2015    --------------------------------------------------------------------------------------------------  ASSESSMENT AND PLAN: Permanent atrial fibrillation The patient's heart rate is suboptimally controlled, though on recheck of her pulse after resting for several minutes, it had come down to 100 bpm. She is asymptomatic. We have agreed to increase metoprolol tart trait to 25 mg daily. We spoke at length regarding continuation of anticoagulation, given her history of falls. With her CHADSVASC score of at least 4, she certainly would benefit from stroke prophylaxis. We have agreed to continue with her current dose of apixaban (2.5 mg twice a day based on age and weight). However, if she has further falls, we will need to discontinue this In favor of  low-dose aspirin or no anticoagulation.  Hypertension Blood pressure is normal today. I have asked the patient to alert [place] if she develops lightheadedness or dizziness with escalation of metoprolol.  Shortness of breath This has been a chronic problem and is stable. It is most likely due to underlying lung disease, his recent echo did not show any significant structural abnormalities. I will check a CBC, basic metabolic panel, and magnesium level today in the setting of her tachycardia, shortness of breath, and history of borderline hyperkalemia.  Follow-up: Return to clinic in 1 month.  Nelva Bush, MD 09/17/2016 2:50 PM

## 2016-09-17 NOTE — Patient Instructions (Signed)
Medication Instructions:  Your physician has recommended you make the following change in your medication:  1- INCREASE Metoprolol to 25 mg (1 tablet) by mouth twice a day.   Labwork: Your physician recommends that you return for lab work in: TODAY (BMP, Mg, CBC). - Please go to the [facility]. You will check in at the front desk to the right as you walk into the atrium. Valet Parking is offered if needed.     Testing/Procedures: none  Follow-Up: Your physician recommends that you schedule a follow-up appointment in: [place].   If you need a refill on your cardiac medications before your next appointment, please call your pharmacy.

## 2016-10-06 DIAGNOSIS — M810 Age-related osteoporosis without current pathological fracture: Secondary | ICD-10-CM | POA: Diagnosis not present

## 2016-10-20 ENCOUNTER — Encounter: Payer: Self-pay | Admitting: Physician Assistant

## 2016-10-20 ENCOUNTER — Ambulatory Visit (INDEPENDENT_AMBULATORY_CARE_PROVIDER_SITE_OTHER): Payer: Medicare Other | Admitting: Physician Assistant

## 2016-10-20 VITALS — BP 108/64 | HR 122 | Ht 59.0 in | Wt 102.5 lb

## 2016-10-20 DIAGNOSIS — I10 Essential (primary) hypertension: Secondary | ICD-10-CM

## 2016-10-20 DIAGNOSIS — I482 Chronic atrial fibrillation, unspecified: Secondary | ICD-10-CM

## 2016-10-20 DIAGNOSIS — I272 Pulmonary hypertension, unspecified: Secondary | ICD-10-CM

## 2016-10-20 DIAGNOSIS — Z79899 Other long term (current) drug therapy: Secondary | ICD-10-CM

## 2016-10-20 MED ORDER — DIGOXIN 125 MCG PO TABS: 0.1250 mg | ORAL_TABLET | Freq: Every day | ORAL | 3 refills | Status: DC

## 2016-10-20 NOTE — Patient Instructions (Signed)
Medication Instructions:  Your physician has recommended you make the following change in your medication:  1- START Digoxin 0.125 mg (1 tablet) by mouth once a day.   Labwork: Your physician recommends that you return for lab work in: TODAY (BMP).  Your physician recommends that you return for lab work in: Friday (BMP, DIGOXIN). Please go to the [facility]. You will check in at the front desk to the right as you walk into the atrium. Valet Parking is offered if needed.    Testing/Procedures: none  Follow-Up: Your physician recommends that you schedule a follow-up appointment in: [place].   If you need a refill on your cardiac medications before your next appointment, please call your pharmacy.   Digoxin tablets or capsules What is this medicine? DIGOXIN (di JOX in) is used to treat congestive heart failure and heart rhythm problems. This medicine may be used for other purposes; ask your health care provider or pharmacist if you have questions. COMMON BRAND NAME(S): Digitek, Lanoxicaps, Lanoxin What should I tell my health care provider before I take this medicine? They need to know if you have any of these conditions: -certain heart rhythm disorders -heart disease or recent heart attack -kidney or liver disease -an unusual or allergic reaction to digoxin, other medicines, foods, dyes, or preservatives -pregnant or trying to get pregnant -breast-feeding How should I use this medicine? Take this medicine by mouth with a glass of water. Follow the directions on the prescription label. Take your doses at regular intervals. Do not take your medicine more often than directed. Talk to your pediatrician regarding the use of this medicine in children. Special care may be needed. Overdosage: If you think you have taken too much of this medicine contact a poison control center or emergency room at once. NOTE: This medicine is only for you. Do not share this medicine  with others. What if I miss a dose? If you miss a dose, take it as soon as you can. If it is almost time for your next dose, take only that dose. Do not take double or extra doses. What may interact with this medicine? -activated charcoal -albuterol -alprazolam -antacids -antiviral medicines for HIV or AIDS like ritonavir and saquinavir -calcium -certain antibiotics like azithromycin, clarithromycin, erythromycin, gentamicin, neomycin, trimethoprim, and tetracycline -certain medicines for blood pressure, heart disease, irregular heart beat -certain medicines for cancer -certain medicines for cholesterol like atorvastatin, cholestyramine, and colestipol -certain medicines for diabetes, like acarbose, exenatide, miglitol, and metformin -certain medicines for fungal infections like ketoconazole and itraconazole -certain medicines for stomach problems like omeprazole, esomeprazole, lansoprazole, rabeprazole, metoclopramide, and sucralfate -conivaptan -cyclosporine -diphenoxylate -epinephrine -kaolin; pectin -nefazodone -NSAIDS, medicines for pain and inflammation, like celecoxib, ibuprofen, or naproxen -penicillamine -phenytoin -propantheline -quinine -phenytoin -rifampin -succinylcholine -St. John's Wort -sulfasalazine -teriparatide -thyroid hormones -tolvaptan This list may not describe all possible interactions. Give your health care provider a list of all the medicines, herbs, non-prescription drugs, or dietary supplements you use. Also tell them if you smoke, drink alcohol, or use illegal drugs. Some items may interact with your medicine. What should I watch for while using this medicine? Visit your doctor or health care professional for regular checks on your progress. Do not stop taking this medicine without the advice of your doctor or health care professional, even if you feel better. Do not change the brand you are taking, other brands may affect you differently. Check  your heart rate and blood pressure regularly while  you are taking this medicine. Ask your doctor or health care professional what your heart rate and blood pressure should be, and when you should contact him or her. Your doctor or health care professional also may schedule regular blood tests and electrocardiograms to check your progress. Watch your diet. Less digoxin may be absorbed from the stomach if you have a diet high in bran fiber. Do not treat yourself for coughs, colds or allergies without asking your doctor or health care professional for advice. Some ingredients can increase possible side effects. What side effects may I notice from receiving this medicine? Side effects that you should report to your doctor or health care professional as soon as possible: -allergic reactions like skin rash, itching or hives, swelling of the face, lips, or tongue -changes in behavior, mood, or mental ability -changes in vision -confusion -fast, irregular heartbeat -feeling faint or lightheaded, falls -headache -nausea, vomiting -unusual bleeding, bruising -unusually weak or tired Side effects that usually do not require medical attention (report to your doctor or health care professional if they continue or are bothersome): -breast enlargement in men and women -diarrhea This list may not describe all possible side effects. Call your doctor for medical advice about side effects. You may report side effects to FDA at 1-800-FDA-1088. Where should I keep my medicine? Keep out of the reach of children. Store at room temperature between 15 and 30 degrees C (59 and 86 degrees F). Protect from light and moisture. Throw away any unused medicine after the expiration date. NOTE: This sheet is a summary. It may not cover all possible information. If you have questions about this medicine, talk to your doctor, pharmacist, or health care provider.  2018 Elsevier/Gold Standard (2016-02-06 15:40:26)

## 2016-10-20 NOTE — Progress Notes (Signed)
Cardiology Office Note Date:  10/20/2016  Patient ID:  Peggy Carter, DOB 1929-12-07, MRN [id number] PCP:  Jearld Fenton, NP  Cardiologist:  Dr. Saunders Revel, MD    Chief Complaint: Follow up Afib  History of Present Illness: Peggy Carter is a 83 y.o. female with history of permanent atrial fibrillation on Eliquis, hypertension, and COPD who presents for follow-up of atrial fibrillation.   Patient was previously followed by Dr. Yvone Neu and has most recently reestablished with Dr. Saunders Revel, last being evaluated on 09/17/16. Prior Holter monitor from 05/2015 showed atrial fibrillation with an average heart rate of 80 to bpm (range 49-143 bpm). Occasional PVCs and/or aberrancy. Transthoracic echocardiogram from 05/2015 showed normal LV size and systolic function with an EF of 50-55%, normal wall motion, mild mitral regurgitation, mild left atrial enlargement, normal RV size and systolic function. Mild to moderate tricuspid regurgitation with mild to moderate pulmonary hypertension. Pharmacologic nuclear stress test in 05/2015 was low risk without ischemia or scar. LVEF 55-65%.   She was apparently diagnosed with atrial fibrillation some time and 2016 after a procedure. It was apparently noted on an EKG. At that time she was started on anticoagulation. She has remained on Eliquis since. At her last office visit on 7/18 she was without complaints. She did report having fallen twice between her visits in November and July. She did not recall the circumstances around these falls. Her heart rate was noted to be suboptimally controlled with a rate of 132 bpm at her July office visit. She was asymptomatic. Her metoprolol was increased to 25 mg daily. Given her CHADS2VASc of at least 4, she was continued on Eliquis 2.5 mg bid (dose adjusted for age and weight). It was felt if she had further falls her anticoagulation may need to be held and she would be placed on aspirin at that time. Her shortness of breath was stable  and felt to be most likely secondary to her underlying lung disease. Labs checked at that visit showed an unremarkable CBC, magnesium at goal, potassium at goal, and stable renal function.   She comes in doing well today, though does indicate she forgot to take all of her medications this morning. She indicates she is typically compliant with her meds. She does not check her heart rate or BP at home. She does not known how to check her pulse. She denies any palpitations or increased SOB from her chronic dyspnea. No LE swelling, orthopnea, or early satiety. She does fairly well when ambulating on flat ground, though with stairs she becomes SOB. She or her grandson do not have any concerns at this time.    Past Medical History:  Diagnosis Date  . Acid reflux   . Allergy   . Arthritis   . Asthma   . Blood transfusion without reported diagnosis   . Depression   . Glaucoma   . History of nuclear stress test    a. 05/2015 low risk without ischemia or scar, EF 55-65%  . History of stomach ulcers   . Hypertension   . Permanent atrial fibrillation (HCC)    a. on Eliquis; b. CHADS2VASc => 4 (HTN, age x 2, female); c. Holter 05/2015 showed Afib w/ avg HR 80 bpm (range 49-143 bpm), occasional PVCs  . Pulmonary hypertension ([place])    a. TTE 05/2015: EF 50-55%, nl wall motion, mild MR, mild LAE, nl RV size & sys fxn, mild to mod TR, mild to mod pulm htn w/ PASP 47 mmHg  .  Sjoegren syndrome [facility])     Past Surgical History:  Procedure Laterality Date  . ABDOMINAL HYSTERECTOMY    . FOREARM SURGERY Right   . TONSILLECTOMY    . WRIST SURGERY Left     Current Meds  Medication Sig  . albuterol (PROVENTIL HFA;VENTOLIN HFA) 108 (90 Base) MCG/ACT inhaler Inhale 2 puffs into the lungs every 6 (six) hours.  Marland Kitchen apixaban (ELIQUIS) 2.5 MG TABS tablet TAKE 1 TABLET(2.5 MG) BY MOUTH TWICE DAILY  . brimonidine (ALPHAGAN P) 0.1 % SOLN Apply 2 drops to eye 2 (two) times daily.   . budesonide-formoterol (SYMBICORT)  160-4.5 MCG/ACT inhaler INHALE 2 PUFFS INTO THE LUNGS TWICE DAILY  . metoprolol tartrate (LOPRESSOR) 25 MG tablet Take 1 tablet (25 mg total) by mouth 2 (two) times daily.  Marland Kitchen nystatin (MYCOSTATIN) 100000 UNIT/ML suspension Take 5 mLs (500,000 Units total) by mouth 4 (four) times daily.  Marland Kitchen omeprazole (PRILOSEC) 20 MG capsule TAKE 1 CAPSULE(20 MG) BY MOUTH TWICE DAILY BEFORE A MEAL  . PARoxetine (PAXIL) 10 MG tablet Take 1 tablet (10 mg total) by mouth daily.  . pilocarpine (SALAGEN) 5 MG tablet TAKE 1 TABLET(5 MG) BY MOUTH TWICE DAILY  . potassium chloride (K-DUR) 10 MEQ tablet Take 2 tablets (20 mEq total) by mouth daily.  . Probiotic Product (PROBIOTIC DAILY PO) Take 1 tablet by mouth daily.  . traMADol (ULTRAM) 50 MG tablet TAKE 2 TABLETS BY MOUTH THREE TIMES DAILY AS NEEDED  . UNABLE TO FIND Med Name: Med Pass 2.0 120 mL by mouth three times daily    Allergies:   Penicillins and Codeine   Social History:  The patient  reports that she has never smoked. She has never used smokeless tobacco. She reports that she does not drink alcohol or use drugs.   Family History:  The patient's family history includes Alcohol abuse in her paternal aunt and paternal uncle; Arthritis in her sister; Emphysema in her mother; Heart disease in her mother; Lung cancer in her father; Mental illness in her mother; Stroke in her mother.  ROS:   Review of Systems  Constitutional: Positive for malaise/fatigue. Negative for chills, diaphoresis, fever and weight loss.  HENT: Negative for congestion.   Eyes: Negative for discharge and redness.  Respiratory: Positive for shortness of breath. Negative for cough, hemoptysis, sputum production and wheezing.   Cardiovascular: Negative for chest pain, palpitations, orthopnea, claudication, leg swelling and PND.  Gastrointestinal: Negative for abdominal pain, blood in stool, heartburn, melena, nausea and vomiting.  Genitourinary: Negative for hematuria.  Musculoskeletal:  Negative for falls and myalgias.  Skin: Negative for rash.  Neurological: Negative for dizziness, tingling, tremors, sensory change, speech change, focal weakness, loss of consciousness and weakness.  Endo/Heme/Allergies: Does not bruise/bleed easily.  Psychiatric/Behavioral: Negative for substance abuse. The patient is not nervous/anxious.   All other systems reviewed and are negative.    PHYSICAL EXAM:  VS:  BP 108/64 (BP Location: Left Arm, Patient Position: Sitting, Cuff Size: Normal)   Pulse (!) 122   Ht 4\' 11"  (1.499 m)   Wt 102 lb 8 oz (46.5 kg)   BMI 20.70 kg/m  BMI: Body mass index is 20.7 kg/m.  Physical Exam  Constitutional: She is oriented to person, place, and time. She appears well-developed and well-nourished.  HENT:  Head: Normocephalic and atraumatic.  Eyes: Right eye exhibits no discharge. Left eye exhibits no discharge.  Neck: Normal range of motion. No JVD present.  Cardiovascular: Normal rate, S1 normal, S2 normal  and normal heart sounds.  An irregularly irregular rhythm present. Exam reveals no distant heart sounds, no friction rub, no midsystolic click and no opening snap.   No murmur heard. Heart rate recheck at rest of 92 bpm. Pulse ox 95% room air.   Pulmonary/Chest: Effort normal and breath sounds normal. No respiratory distress. She has no decreased breath sounds. She has no wheezes. She has no rales. She exhibits no tenderness.  Abdominal: Soft. She exhibits no distension. There is no tenderness.  Musculoskeletal: She exhibits no edema.  Neurological: She is alert and oriented to person, place, and time.  Skin: Skin is warm and dry. No cyanosis. Nails show no clubbing.  Psychiatric: She has a normal mood and affect. Her speech is normal and behavior is normal. Judgment and thought content normal.     EKG:  Was ordered and interpreted by me today. Shows Afib with RVR, 122 bpm, left axis deviation, poor R wave progression, no acute st/t changes   Recent  Labs: 06/19/2016: ALT 17 09/17/2016: BUN 37; Creatinine, Ser 1.07; Hemoglobin 13.3; Magnesium 2.1; Platelets 169; Potassium 5.0; Sodium 138  No results found for requested labs within last 8760 hours.   CrCl cannot be calculated (Patient's most recent lab result is older than the maximum 21 days allowed.).   Wt Readings from Last 3 Encounters:  10/20/16 102 lb 8 oz (46.5 kg)  09/17/16 100 lb 12 oz (45.7 kg)  08/20/16 98 lb 1.9 oz (44.5 kg)     Other studies reviewed: Additional studies/records reviewed today include: summarized above  ASSESSMENT AND PLAN:  1. Permanent Afib: Rate initially 122 bpm after walking in from the waiting room. At rest, heart rate improved to 92 bpm. She is completely asymptomatic. Given tachycardic rates noted with ambulation and a resting heart rate in the 90s bpm, we will start patient on digoxin 0.125 mg daily. Check a bmet today and check a digoxin level and bmet on 8/24. Continue Lopressor 25 mg bid (BP precludes further titration at this time). Continue Eliquis 2.5 mg bid, dose adjusted for age and weight. Given she has been in Afib since at least 2016, and she is asymptomatic in this rhythm, we will not make any plans to restore sinus rhythm at this time. No falls since she was last seen.   2. Pulmonary hypertension/chronic SOB: Stable. Schedule echo in the future to trend EF and PASP. Follow up with PCP.   3. HTN: Controlled. BP precludes titration of Lopressor as above.   Disposition: F/u with Dr. Saunders Revel, MD in 1 month.  Current medicines are reviewed at length with the patient today.  The patient did not have any concerns regarding medicines.  Melvern Banker PA-C 10/20/2016 2:06 PM     [address] [place], [place] [address] [phone]

## 2016-10-21 ENCOUNTER — Encounter: Payer: Self-pay | Admitting: Internal Medicine

## 2016-10-21 ENCOUNTER — Other Ambulatory Visit: Payer: Self-pay | Admitting: Internal Medicine

## 2016-10-21 ENCOUNTER — Ambulatory Visit (INDEPENDENT_AMBULATORY_CARE_PROVIDER_SITE_OTHER): Payer: Medicare Other | Admitting: Internal Medicine

## 2016-10-21 VITALS — BP 108/68 | HR 88 | Temp 97.5°F | Ht <= 58 in | Wt 101.8 lb

## 2016-10-21 DIAGNOSIS — K219 Gastro-esophageal reflux disease without esophagitis: Secondary | ICD-10-CM

## 2016-10-21 DIAGNOSIS — Z0000 Encounter for general adult medical examination without abnormal findings: Secondary | ICD-10-CM

## 2016-10-21 DIAGNOSIS — M1990 Unspecified osteoarthritis, unspecified site: Secondary | ICD-10-CM

## 2016-10-21 DIAGNOSIS — I482 Chronic atrial fibrillation: Secondary | ICD-10-CM | POA: Diagnosis not present

## 2016-10-21 DIAGNOSIS — M3500 Sicca syndrome, unspecified: Secondary | ICD-10-CM

## 2016-10-21 DIAGNOSIS — J4520 Mild intermittent asthma, uncomplicated: Secondary | ICD-10-CM

## 2016-10-21 DIAGNOSIS — F32A Depression, unspecified: Secondary | ICD-10-CM

## 2016-10-21 DIAGNOSIS — I10 Essential (primary) hypertension: Secondary | ICD-10-CM | POA: Diagnosis not present

## 2016-10-21 DIAGNOSIS — I4821 Permanent atrial fibrillation: Secondary | ICD-10-CM

## 2016-10-21 DIAGNOSIS — F329 Major depressive disorder, single episode, unspecified: Secondary | ICD-10-CM | POA: Diagnosis not present

## 2016-10-21 LAB — BASIC METABOLIC PANEL
BUN/Creatinine Ratio: 37 — ABNORMAL HIGH (ref 12–28)
BUN: 34 mg/dL — ABNORMAL HIGH (ref 8–27)
CO2: 17 mmol/L — ABNORMAL LOW (ref 20–29)
Calcium: 10 mg/dL (ref 8.7–10.3)
Chloride: 108 mmol/L — ABNORMAL HIGH (ref 96–106)
Creatinine, Ser: 0.93 mg/dL (ref 0.57–1.00)
GFR, EST AFRICAN AMERICAN: 65 mL/min/{1.73_m2} (ref >59)
GFR, EST NON AFRICAN AMERICAN: 57 mL/min/{1.73_m2} — AB (ref >59)
Glucose: 81 mg/dL (ref 65–99)
POTASSIUM: 5.4 mmol/L — AB (ref 3.5–5.2)
SODIUM: 139 mmol/L (ref 134–144)

## 2016-10-21 NOTE — Telephone Encounter (Signed)
Last filled 09/16/16... Please advise 

## 2016-10-21 NOTE — Assessment & Plan Note (Signed)
Will monitor

## 2016-10-21 NOTE — Telephone Encounter (Signed)
Ok to phone in Stephan

## 2016-10-21 NOTE — Assessment & Plan Note (Signed)
Continue Prilosec Will monitor

## 2016-10-21 NOTE — Assessment & Plan Note (Signed)
Stable on Paxil Will monitor.

## 2016-10-21 NOTE — Assessment & Plan Note (Signed)
Continue Pilocarpine She will continue to follow with rheumatology

## 2016-10-21 NOTE — Assessment & Plan Note (Signed)
Advised her to try Tylenol Arthritis BID Continue Ultram as needed, refilled today

## 2016-10-21 NOTE — Assessment & Plan Note (Signed)
Stable on Symbicort Will monitor

## 2016-10-21 NOTE — Telephone Encounter (Signed)
Rx called in to pharmacy. 

## 2016-10-21 NOTE — Patient Instructions (Signed)
Health Maintenance for Postmenopausal Women Menopause is a normal process in which your reproductive ability comes to an end. This process happens gradually over a span of months to years, usually between the ages of 22 and 9. Menopause is complete when you have missed 12 consecutive menstrual periods. It is important to talk with your health care provider about some of the most common conditions that affect postmenopausal women, such as heart disease, cancer, and bone loss (osteoporosis). Adopting a healthy lifestyle and getting preventive care can help to promote your health and wellness. Those actions can also lower your chances of developing some of these common conditions. What should I know about menopause? During menopause, you may experience a number of symptoms, such as:  Moderate-to-severe hot flashes.  Night sweats.  Decrease in sex drive.  Mood swings.  Headaches.  Tiredness.  Irritability.  Memory problems.  Insomnia.  Choosing to treat or not to treat menopausal changes is an individual decision that you make with your health care provider. What should I know about hormone replacement therapy and supplements? Hormone therapy products are effective for treating symptoms that are associated with menopause, such as hot flashes and night sweats. Hormone replacement carries certain risks, especially as you become older. If you are thinking about using estrogen or estrogen with progestin treatments, discuss the benefits and risks with your health care provider. What should I know about heart disease and stroke? Heart disease, heart attack, and stroke become more likely as you age. This may be due, in part, to the hormonal changes that your body experiences during menopause. These can affect how your body processes dietary fats, triglycerides, and cholesterol. Heart attack and stroke are both medical emergencies. There are many things that you can do to help prevent heart disease  and stroke:  Have your blood pressure checked at least every 1-2 years. High blood pressure causes heart disease and increases the risk of stroke.  If you are 53-22 years old, ask your health care provider if you should take aspirin to prevent a heart attack or a stroke.  Do not use any tobacco products, including cigarettes, chewing tobacco, or electronic cigarettes. If you need help quitting, ask your health care provider.  It is important to eat a healthy diet and maintain a healthy weight. ? Be sure to include plenty of vegetables, fruits, low-fat dairy products, and lean protein. ? Avoid eating foods that are high in solid fats, added sugars, or salt (sodium).  Get regular exercise. This is one of the most important things that you can do for your health. ? Try to exercise for at least 150 minutes each week. The type of exercise that you do should increase your heart rate and make you sweat. This is known as moderate-intensity exercise. ? Try to do strengthening exercises at least twice each week. Do these in addition to the moderate-intensity exercise.  Know your numbers.Ask your health care provider to check your cholesterol and your blood glucose. Continue to have your blood tested as directed by your health care provider.  What should I know about cancer screening? There are several types of cancer. Take the following steps to reduce your risk and to catch any cancer development as early as possible. Breast Cancer  Practice breast self-awareness. ? This means understanding how your breasts normally appear and feel. ? It also means doing regular breast self-exams. Let your health care provider know about any changes, no matter how small.  If you are 40  or older, have a clinician do a breast exam (clinical breast exam or CBE) every year. Depending on your age, family history, and medical history, it may be recommended that you also have a yearly breast X-ray (mammogram).  If you  have a family history of breast cancer, talk with your health care provider about genetic screening.  If you are at high risk for breast cancer, talk with your health care provider about having an MRI and a mammogram every year.  Breast cancer (BRCA) gene test is recommended for women who have family members with BRCA-related cancers. Results of the assessment will determine the need for genetic counseling and BRCA1 and for BRCA2 testing. BRCA-related cancers include these types: ? Breast. This occurs in males or females. ? Ovarian. ? Tubal. This may also be called fallopian tube cancer. ? Cancer of the abdominal or pelvic lining (peritoneal cancer). ? Prostate. ? Pancreatic.  Cervical, Uterine, and Ovarian Cancer Your health care provider may recommend that you be screened regularly for cancer of the pelvic organs. These include your ovaries, uterus, and vagina. This screening involves a pelvic exam, which includes checking for microscopic changes to the surface of your cervix (Pap test).  For women ages 21-65, health care providers may recommend a pelvic exam and a Pap test every three years. For women ages 79-65, they may recommend the Pap test and pelvic exam, combined with testing for human papilloma virus (HPV), every five years. Some types of HPV increase your risk of cervical cancer. Testing for HPV may also be done on women of any age who have unclear Pap test results.  Other health care providers may not recommend any screening for nonpregnant women who are considered low risk for pelvic cancer and have no symptoms. Ask your health care provider if a screening pelvic exam is right for you.  If you have had past treatment for cervical cancer or a condition that could lead to cancer, you need Pap tests and screening for cancer for at least 20 years after your treatment. If Pap tests have been discontinued for you, your risk factors (such as having a new sexual partner) need to be  reassessed to determine if you should start having screenings again. Some women have medical problems that increase the chance of getting cervical cancer. In these cases, your health care provider may recommend that you have screening and Pap tests more often.  If you have a family history of uterine cancer or ovarian cancer, talk with your health care provider about genetic screening.  If you have vaginal bleeding after reaching menopause, tell your health care provider.  There are currently no reliable tests available to screen for ovarian cancer.  Lung Cancer Lung cancer screening is recommended for adults 69-62 years old who are at high risk for lung cancer because of a history of smoking. A yearly low-dose CT scan of the lungs is recommended if you:  Currently smoke.  Have a history of at least 30 pack-years of smoking and you currently smoke or have quit within the past 15 years. A pack-year is smoking an average of one pack of cigarettes per day for one year.  Yearly screening should:  Continue until it has been 15 years since you quit.  Stop if you develop a health problem that would prevent you from having lung cancer treatment.  Colorectal Cancer  This type of cancer can be detected and can often be prevented.  Routine colorectal cancer screening usually begins at  age 42 and continues through age 45.  If you have risk factors for colon cancer, your health care provider may recommend that you be screened at an earlier age.  If you have a family history of colorectal cancer, talk with your health care provider about genetic screening.  Your health care provider may also recommend using home test kits to check for hidden blood in your stool.  A small camera at the end of a tube can be used to examine your colon directly (sigmoidoscopy or colonoscopy). This is done to check for the earliest forms of colorectal cancer.  Direct examination of the colon should be repeated every  5-10 years until age 71. However, if early forms of precancerous polyps or small growths are found or if you have a family history or genetic risk for colorectal cancer, you may need to be screened more often.  Skin Cancer  Check your skin from head to toe regularly.  Monitor any moles. Be sure to tell your health care provider: ? About any new moles or changes in moles, especially if there is a change in a mole's shape or color. ? If you have a mole that is larger than the size of a pencil eraser.  If any of your family members has a history of skin cancer, especially at a young age, talk with your health care provider about genetic screening.  Always use sunscreen. Apply sunscreen liberally and repeatedly throughout the day.  Whenever you are outside, protect yourself by wearing long sleeves, pants, a wide-brimmed hat, and sunglasses.  What should I know about osteoporosis? Osteoporosis is a condition in which bone destruction happens more quickly than new bone creation. After menopause, you may be at an increased risk for osteoporosis. To help prevent osteoporosis or the bone fractures that can happen because of osteoporosis, the following is recommended:  If you are 46-71 years old, get at least 1,000 mg of calcium and at least 600 mg of vitamin D per day.  If you are older than age 55 but younger than age 65, get at least 1,200 mg of calcium and at least 600 mg of vitamin D per day.  If you are older than age 54, get at least 1,200 mg of calcium and at least 800 mg of vitamin D per day.  Smoking and excessive alcohol intake increase the risk of osteoporosis. Eat foods that are rich in calcium and vitamin D, and do weight-bearing exercises several times each week as directed by your health care provider. What should I know about how menopause affects my mental health? Depression may occur at any age, but it is more common as you become older. Common symptoms of depression  include:  Low or sad mood.  Changes in sleep patterns.  Changes in appetite or eating patterns.  Feeling an overall lack of motivation or enjoyment of activities that you previously enjoyed.  Frequent crying spells.  Talk with your health care provider if you think that you are experiencing depression. What should I know about immunizations? It is important that you get and maintain your immunizations. These include:  Tetanus, diphtheria, and pertussis (Tdap) booster vaccine.  Influenza every year before the flu season begins.  Pneumonia vaccine.  Shingles vaccine.  Your health care provider may also recommend other immunizations. This information is not intended to replace advice given to you by your health care provider. Make sure you discuss any questions you have with your health care provider. Document Released: 04/11/2005  Document Revised: 09/07/2015 Document Reviewed: 11/21/2014 Elsevier Interactive Patient Education  2018 [employer]  

## 2016-10-21 NOTE — Progress Notes (Signed)
HPI:  Pt presents to the clinic today for her Medicare Wellness Exam.  GERD: She denies breakthrough symptoms on Prilosec.   OA: She reports she hurts "all over" She takes the Tramadol and reports it takes the edge off but it does take the pain completely away.   Asthma: She denies cough, wheezing or persistent SOB. She takes Symbicort as prescribed.  HTN: Her BP today is 108/68. She takes Metoprolol and Digoxin (Afib) as prescribed. She follows with cardiology.  Afib: Persistent. Managed on Metoprolol, Digoxin and Eliquis. She denies chest pain and has not noticed any s/s of bleeding.  Depression: Mild but stable on Paxil. She denies anxiety, SI/HI.  Sjoegren's Syndrome: Managed on Pilocarpine. She follows with Dr. Jefm Bryant.  Past Medical History:  Diagnosis Date  . Acid reflux   . Allergy   . Arthritis   . Asthma   . Blood transfusion without reported diagnosis   . Depression   . Glaucoma   . History of nuclear stress test    a. 05/2015 low risk without ischemia or scar, EF 55-65%  . History of stomach ulcers   . Hypertension   . Permanent atrial fibrillation (HCC)    a. on Eliquis; b. CHADS2VASc => 4 (HTN, age x 2, female); c. Holter 05/2015 showed Afib w/ avg HR 80 bpm (range 49-143 bpm), occasional PVCs  . Pulmonary hypertension ([place])    a. TTE 05/2015: EF 50-55%, nl wall motion, mild MR, mild LAE, nl RV size & sys fxn, mild to mod TR, mild to mod pulm htn w/ PASP 47 mmHg  . Sjoegren syndrome [id number])     Current Outpatient Prescriptions  Medication Sig Dispense Refill  . albuterol (PROVENTIL HFA;VENTOLIN HFA) 108 (90 Base) MCG/ACT inhaler Inhale 2 puffs into the lungs every 6 (six) hours.    Marland Kitchen apixaban (ELIQUIS) 2.5 MG TABS tablet TAKE 1 TABLET(2.5 MG) BY MOUTH TWICE DAILY 180 tablet 1  . brimonidine (ALPHAGAN P) 0.1 % SOLN Apply 2 drops to eye 2 (two) times daily.     . budesonide-formoterol (SYMBICORT) 160-4.5 MCG/ACT inhaler INHALE 2 PUFFS INTO THE LUNGS TWICE DAILY 10.2  g 2  . digoxin (DIGITEK) 0.125 MG tablet Take 1 tablet (0.125 mg total) by mouth daily. 90 tablet 3  . metoprolol tartrate (LOPRESSOR) 25 MG tablet Take 1 tablet (25 mg total) by mouth 2 (two) times daily. 180 tablet 3  . nystatin (MYCOSTATIN) 100000 UNIT/ML suspension Take 5 mLs (500,000 Units total) by mouth 4 (four) times daily. 200 mL 0  . omeprazole (PRILOSEC) 20 MG capsule TAKE 1 CAPSULE(20 MG) BY MOUTH TWICE DAILY BEFORE A MEAL 180 capsule 1  . PARoxetine (PAXIL) 10 MG tablet Take 1 tablet (10 mg total) by mouth daily. 90 tablet 1  . pilocarpine (SALAGEN) 5 MG tablet TAKE 1 TABLET(5 MG) BY MOUTH TWICE DAILY 180 tablet 1  . potassium chloride (K-DUR) 10 MEQ tablet Take 2 tablets (20 mEq total) by mouth daily. 60 tablet 2  . Probiotic Product (PROBIOTIC DAILY PO) Take 1 tablet by mouth daily.    . traMADol (ULTRAM) 50 MG tablet TAKE 2 TABLETS BY MOUTH THREE TIMES DAILY AS NEEDED 180 tablet 0   No current facility-administered medications for this visit.     Allergies  Allergen Reactions  . Penicillins Anaphylaxis and Hives    States "almost died" Has patient had a PCN reaction causing immediate rash, facial/tongue/throat swelling, SOB or lightheadedness with hypotension: Yes Has patient had a PCN reaction causing  severe rash involving mucus membranes or skin necrosis: No Has patient had a PCN reaction that required hospitalization No Has patient had a PCN reaction occurring within the last 10 years: No If all of the above answers are "NO", then may proceed with Cephalosporin use.  . Codeine Hives    Family History  Problem Relation Age of Onset  . Heart disease Mother   . Stroke Mother   . Mental illness Mother   . Emphysema Mother   . Lung cancer Father   . Arthritis Sister   . Alcohol abuse Paternal Aunt   . Alcohol abuse Paternal Uncle   . Diabetes Neg Hx     Social History   Social History  . Marital status: Divorced    Spouse name: N/A  . Number of children: N/A   . Years of education: N/A   Occupational History  . Not on file.   Social History Main Topics  . Smoking status: Never Smoker  . Smokeless tobacco: Never Used  . Alcohol use No  . Drug use: No  . Sexual activity: Not on file   Other Topics Concern  . Not on file   Social History Narrative  . No narrative on file    Hospitiliaztions: None  Health Maintenance:    Flu: 01/2016  Tetanus: declines  Pneumovax: declines  Prevnar: 10/2015  Zostavax: declines  Mammogram: declines  Pap Smear: declines  Bone Density: declines  Colon Screening: declines  Eye Doctor: 10/2015, [facility]  Dental Exam: as needed   Providers:   PCP: Webb Silversmith, NP-C  Cardiologist: Dr. Yvone Neu  Rheumatology: Dr. Jefm Bryant   I have personally reviewed and have noted:  1. The patient's medical and social history 2. Their use of alcohol, tobacco or illicit drugs 3. Their current medications and supplements 4. The patient's functional ability including ADL's, fall risks, home safety risks and hearing or visual impairment. 5. Diet and physical activities 6. Evidence for depression or mood disorder  Subjective:   Review of Systems:   Constitutional: Pt reports fatigue. Denies fever, malaise, headache or abrupt weight changes.  HEENT: Pt reports decreased hearing. Denies eye pain, eye redness, ear pain, ringing in the ears, wax buildup, runny nose, nasal congestion, bloody nose, or sore throat. Respiratory: Pt reports dyspnea on exertion. Denies difficulty breathing, cough or sputum production.   Cardiovascular: Denies chest pain, chest tightness, palpitations or swelling in the hands or feet.  Gastrointestinal: Denies abdominal pain, bloating, constipation, diarrhea or blood in the stool.  GU: Denies urgency, frequency, pain with urination, burning sensation, blood in urine, odor or discharge. Musculoskeletal: Pt reports joint pain. Denies decrease in range of motion, difficulty with gait,  muscle pain or joint swelling.  Skin: Denies redness, rashes, lesions or ulcercations.  Neurological: Denies dizziness, difficulty with memory, difficulty with speech or problems with balance and coordination.  Psych: Pt has history of depression. Denies anxiety, SI/HI.  No other specific complaints in a complete review of systems (except as listed in HPI above).  Objective:  PE:  BP 108/68   Pulse 88   Temp (!) 97.5 F (36.4 C) (Oral)   Ht 4\' 9"  (1.448 m)   Wt 101 lb 12 oz (46.2 kg)   BMI 22.02 kg/m   Wt Readings from Last 3 Encounters:  10/21/16 101 lb 12 oz (46.2 kg)  10/20/16 102 lb 8 oz (46.5 kg)  09/17/16 100 lb 12 oz (45.7 kg)    General: Appears her stated age,  in NAD. Skin: Bruising noted on bilateral arms.  Cardiovascular: Normal rate with irregular rhythm. No JVD or BLE edema. No carotid bruits noted. Pulmonary/Chest: Normal effort and positive vesicular breath sounds. No respiratory distress. No wheezes, rales or ronchi noted.  Abdomen: Soft and nontender. Normal bowel sounds. No distention or masses noted.  Musculoskeletal: Kyphotic. Strength 5/5 BUE/BLE. Uses rolling walker for assistance with gait. Neurological: Alert and oriented.  Psychiatric: Mood and affect normal. Behavior is normal. Judgment and thought content normal.     BMET    Component Value Date/Time   NA 139 10/20/2016 1425   NA 142 06/06/2012 1322   K 5.4 (H) 10/20/2016 1425   K 3.5 06/06/2012 1322   CL 108 (H) 10/20/2016 1425   CL 114 (H) 06/06/2012 1322   CO2 17 (L) 10/20/2016 1425   CO2 21 06/06/2012 1322   GLUCOSE 81 10/20/2016 1425   GLUCOSE 81 09/17/2016 1608   GLUCOSE 95 06/06/2012 1322   BUN 34 (H) 10/20/2016 1425   BUN 14 06/06/2012 1322   CREATININE 0.93 10/20/2016 1425   CREATININE 0.97 06/06/2012 1322   CALCIUM 10.0 10/20/2016 1425   CALCIUM 9.8 06/06/2012 1322   GFRNONAA 57 (L) 10/20/2016 1425   GFRNONAA 55 (L) 06/06/2012 1322   GFRAA 65 10/20/2016 1425   GFRAA >60  06/06/2012 1322    Lipid Panel     Component Value Date/Time   CHOL 142 10/18/2015 0858   TRIG 76.0 10/18/2015 0858   HDL 46.30 10/18/2015 0858   CHOLHDL 3 10/18/2015 0858   VLDL 15.2 10/18/2015 0858   LDLCALC 80 10/18/2015 0858    CBC    Component Value Date/Time   WBC 4.1 09/17/2016 1608   RBC 4.23 09/17/2016 1608   HGB 13.3 09/17/2016 1608   HGB 10.9 (L) 06/06/2012 1322   HCT 40.3 09/17/2016 1608   HCT 33.4 (L) 06/06/2012 1322   PLT 169 09/17/2016 1608   PLT 404 06/06/2012 1322   MCV 95.2 09/17/2016 1608   MCV 82 06/06/2012 1322   MCH 31.4 09/17/2016 1608   MCHC 32.9 09/17/2016 1608   RDW 13.7 09/17/2016 1608   RDW 15.8 (H) 06/06/2012 1322   LYMPHSABS 1.1 09/17/2016 1608   MONOABS 0.3 09/17/2016 1608   EOSABS 0.0 09/17/2016 1608   BASOSABS 0.1 09/17/2016 1608    Hgb A1C No results found for: HGBA1C    Assessment and Plan:   Medicare Annual Wellness Visit:  Diet: She does not eat a lot of meat. She consumes fruits and veggies daily. She does not eat fried foods. She drinks mostly water and Boost. Physical activity: Sedentary Depression/mood screen: Chronic but stable on meds.  Hearing: Intact to whispered voice Visual acuity: Grossly normal, performs annual eye exam  ADLs: Capable Fall risk: Moderate Fall Risk Home safety: Good Cognitive evaluation: Intact to orientation, naming, recall and repetition EOL planning: Adv directives, full code/ I agree  Preventative Medicine: Encouraged her to get a flu shot in the fall. Prevnar UTD. She declines tetanus, pneumovax, zostovax, shingrix, mammogram, pelvic exam, bone density exam, colon cancer screening. Encouraged her to consume a balanced diet and exercise regimen. Advised her to see an eye doctor and dentist annually. No labs indicated today.   Next appointment: 1 year, Medicare Wellness Exam   Webb Silversmith, NP

## 2016-10-21 NOTE — Assessment & Plan Note (Signed)
Continue Metoprolol, Digoxin and Eliquis She will continue to follow with Cardiology

## 2016-10-24 ENCOUNTER — Other Ambulatory Visit
Admission: RE | Admit: 2016-10-24 | Discharge: 2016-10-24 | Disposition: A | Discharge status: Home / Self Care | Payer: Medicare Other | Source: Ambulatory Visit | Attending: Physician Assistant | Admitting: Physician Assistant

## 2016-10-24 ENCOUNTER — Other Ambulatory Visit: Payer: Self-pay

## 2016-10-24 DIAGNOSIS — I10 Essential (primary) hypertension: Secondary | ICD-10-CM | POA: Insufficient documentation

## 2016-10-24 DIAGNOSIS — Z79899 Other long term (current) drug therapy: Secondary | ICD-10-CM | POA: Diagnosis not present

## 2016-10-24 DIAGNOSIS — I4821 Permanent atrial fibrillation: Secondary | ICD-10-CM

## 2016-10-24 LAB — BASIC METABOLIC PANEL
Anion gap: 5 (ref 5–15)
BUN: 34 mg/dL — AB (ref 6–20)
CHLORIDE: 112 mmol/L — AB (ref 101–111)
CO2: 23 mmol/L (ref 22–32)
CREATININE: 1.1 mg/dL — AB (ref 0.44–1.00)
Calcium: 10.4 mg/dL — ABNORMAL HIGH (ref 8.9–10.3)
GFR calc Af Amer: 52 mL/min — ABNORMAL LOW (ref >60)
GFR calc non Af Amer: 44 mL/min — ABNORMAL LOW (ref >60)
GLUCOSE: 64 mg/dL — AB (ref 65–99)
Potassium: 4.6 mmol/L (ref 3.5–5.1)
SODIUM: 140 mmol/L (ref 135–145)

## 2016-10-24 LAB — DIGOXIN LEVEL: DIGOXIN LVL: 0.8 ng/mL (ref 0.8–2.0)

## 2016-10-24 NOTE — Progress Notes (Unsigned)
dioxi

## 2016-11-13 ENCOUNTER — Encounter: Payer: Self-pay | Admitting: Internal Medicine

## 2016-11-13 ENCOUNTER — Encounter: Payer: Self-pay | Admitting: Nurse Practitioner

## 2016-11-13 ENCOUNTER — Other Ambulatory Visit: Payer: Self-pay | Admitting: Internal Medicine

## 2016-11-18 ENCOUNTER — Encounter: Payer: Self-pay | Admitting: Internal Medicine

## 2016-11-18 ENCOUNTER — Other Ambulatory Visit
Admission: RE | Admit: 2016-11-18 | Discharge: 2016-11-18 | Disposition: A | Discharge status: Home / Self Care | Payer: Medicare Other | Source: Ambulatory Visit | Attending: Physician Assistant | Admitting: Physician Assistant

## 2016-11-18 ENCOUNTER — Other Ambulatory Visit: Payer: Self-pay | Admitting: Internal Medicine

## 2016-11-18 DIAGNOSIS — I482 Chronic atrial fibrillation: Secondary | ICD-10-CM | POA: Insufficient documentation

## 2016-11-18 DIAGNOSIS — I4821 Permanent atrial fibrillation: Secondary | ICD-10-CM

## 2016-11-18 LAB — DIGOXIN LEVEL: Digoxin Level: 1.3 ng/mL (ref 0.8–2.0)

## 2016-11-18 NOTE — Telephone Encounter (Signed)
Last filled 10/21/16... Please advise

## 2016-11-18 NOTE — Telephone Encounter (Signed)
Ok to phone in Tramadol 

## 2016-11-19 ENCOUNTER — Other Ambulatory Visit: Payer: Self-pay | Admitting: *Deleted

## 2016-11-19 DIAGNOSIS — R7889 Finding of other specified substances, not normally found in blood: Secondary | ICD-10-CM

## 2016-11-20 ENCOUNTER — Encounter: Payer: Self-pay | Admitting: Nurse Practitioner

## 2016-11-20 ENCOUNTER — Ambulatory Visit (INDEPENDENT_AMBULATORY_CARE_PROVIDER_SITE_OTHER): Payer: Medicare Other | Admitting: Nurse Practitioner

## 2016-11-20 VITALS — BP 106/64 | HR 66 | Ht 59.0 in | Wt 102.5 lb

## 2016-11-20 DIAGNOSIS — I482 Chronic atrial fibrillation: Secondary | ICD-10-CM

## 2016-11-20 DIAGNOSIS — I10 Essential (primary) hypertension: Secondary | ICD-10-CM

## 2016-11-20 DIAGNOSIS — I4821 Permanent atrial fibrillation: Secondary | ICD-10-CM

## 2016-11-20 MED ORDER — DIGOXIN 62.5 MCG PO TABS: 0.0625 mg | ORAL_TABLET | Freq: Every day | ORAL | 5 refills | Status: DC

## 2016-11-20 NOTE — Patient Instructions (Signed)
Medication Instructions:  Your physician has recommended you make the following change in your medication:  DECREASE digoxin to 0.0625mg  once daily   Labwork: Digoxin level in 10 days at PCP office  Testing/Procedures: none  Follow-Up: Your physician recommends that you schedule a follow-up appointment in: 3-4 months with Dr. Saunders Revel.    Any Other Special Instructions Will Be Listed Below (If Applicable).     If you need a refill on your cardiac medications before your next appointment, please call your pharmacy.

## 2016-11-20 NOTE — Progress Notes (Signed)
Office Visit    Patient Name: Peggy Carter Date of Encounter: 11/20/2016  Primary Care Provider:  Jearld Fenton, NP Primary Cardiologist:  Andree Coss, MD   Chief Complaint    83 y/o ? With a history of permanent atrial fibrillation, hypertension, pulmonary arterial hypertension, and tricuspid regurgitation, who presents for follow-up.  Past Medical History    Past Medical History:  Diagnosis Date  . Acid reflux   . Allergy   . Arthritis   . Asthma   . Blood transfusion without reported diagnosis   . Depression   . Glaucoma   . History of nuclear stress test    a. 05/2015 low risk without ischemia or scar, EF 55-65%  . History of stomach ulcers   . Hypertension   . Permanent atrial fibrillation (HCC)    a. on Eliquis; b. CHADS2VASc => 4 (HTN, age x 2, female); c. Holter 05/2015 showed Afib w/ avg HR 80 bpm (range 49-143 bpm), occasional PVCs  . Pulmonary hypertension ([place])    a. TTE 05/2015: EF 50-55%, nl wall motion, mild MR, mild LAE, nl RV size & sys fxn, mild to mod TR, mild to mod pulm htn w/ PASP 47 mmHg  . Sjoegren syndrome ([place])   . Tricuspid regurgitation    a. 05/2015 Echo: mild to mod TR in setting of mild to mod PAH.   Past Surgical History:  Procedure Laterality Date  . ABDOMINAL HYSTERECTOMY    . FOREARM SURGERY Right   . TONSILLECTOMY    . WRIST SURGERY Left     Allergies  Allergies  Allergen Reactions  . Penicillins Anaphylaxis and Hives    States "almost died" Has patient had a PCN reaction causing immediate rash, facial/tongue/throat swelling, SOB or lightheadedness with hypotension: Yes Has patient had a PCN reaction causing severe rash involving mucus membranes or skin necrosis: No Has patient had a PCN reaction that required hospitalization No Has patient had a PCN reaction occurring within the last 10 years: No If all of the above answers are "NO", then may proceed with Cephalosporin use.  . Codeine Hives    History of Present Illness      83 year old ?  with a history of permanent atrial fibrillation on chronic eliquis anticoagulation, hypertension, pulmonary hypertension, glaucoma, GERD, and tricuspid regurgitation. History of atrial fibrillation dates back at least to 2016. Heart rates has historically been suboptimally controlled despite titration of beta blocker in the past. Upon her last visit on August 20, digoxin 125 g was added. Patient was asymptomatic at the time despite heart rates in the 120s. Follow-up digoxin level was obtained on August 24 and was 0.8. She had repeat digoxin level 2 days ago, and this was slightly higher at 1.3. Patient says she has been feeling well and tolerating medication well. She denies chest pain, dyspnea, palpitations, PND, orthopnea, dizziness, syncope, edema, or early satiety. Her son is with her today and reports that she's been doing well. Her activity is fairly limited and she does use a walker to get around.   Home Medications    Prior to Admission medications   Medication Sig Start Date End Date Taking? Authorizing Provider  albuterol (PROVENTIL HFA;VENTOLIN HFA) 108 (90 Base) MCG/ACT inhaler Inhale 2 puffs into the lungs every 6 (six) hours.   Yes [provider]  apixaban (ELIQUIS) 2.5 MG TABS tablet TAKE 1 TABLET(2.5 MG) BY MOUTH TWICE DAILY 06/19/16  Yes Baity, Coralie Keens, NP  brimonidine (ALPHAGAN P) 0.1 %  SOLN Apply 2 drops to eye 2 (two) times daily.    Yes [provider]  budesonide-formoterol (SYMBICORT) 160-4.5 MCG/ACT inhaler INHALE 2 PUFFS INTO THE LUNGS TWICE DAILY 06/19/16  Yes Baity, Coralie Keens, NP  metoprolol tartrate (LOPRESSOR) 25 MG tablet Take 1 tablet (25 mg total) by mouth 2 (two) times daily. 09/17/16 12/16/16 Yes End, Harrell Gave, MD  nystatin (MYCOSTATIN) 100000 UNIT/ML suspension Take 5 mLs (500,000 Units total) by mouth 4 (four) times daily. 08/06/16  Yes Tonia Ghent, MD  omeprazole (PRILOSEC) 20 MG capsule TAKE 1 CAPSULE(20 MG) BY MOUTH TWICE  DAILY BEFORE A MEAL 06/19/16  Yes Jearld Fenton, NP  PARoxetine (PAXIL) 10 MG tablet Take 1 tablet (10 mg total) by mouth daily. 06/19/16  Yes Baity, Coralie Keens, NP  pilocarpine (SALAGEN) 5 MG tablet TAKE 1 TABLET(5 MG) BY MOUTH TWICE DAILY 06/19/16  Yes Baity, Coralie Keens, NP  potassium chloride (K-DUR) 10 MEQ tablet Take 2 tablets (20 mEq total) by mouth daily. 07/22/16  Yes Jearld Fenton, NP  Probiotic Product (PROBIOTIC DAILY PO) Take 1 tablet by mouth daily.   Yes [provider]  traMADol (ULTRAM) 50 MG tablet TAKE 1 TABLET BY MOUTH EVERY 8 HOURS AS NEEDED 11/18/16  Yes Jearld Fenton, NP  digoxin 62.5 MCG TABS Take 0.0625 mg by mouth daily. 11/20/16   Rogelia Mire, NP    Review of Systems    She denies chest pain, palpitations, dyspnea, pnd, orthopnea, n, v, dizziness, syncope, edema, weight gain, or early satiety.  All other systems reviewed and are otherwise negative except as noted above.  Physical Exam    VS:  BP 106/64 (BP Location: Right Arm, Patient Position: Sitting, Cuff Size: Normal)   Pulse 66   Ht 4\' 11"  (1.499 m)   Wt 102 lb 8 oz (46.5 kg)   BMI 20.70 kg/m  , BMI Body mass index is 20.7 kg/m. GEN: Well nourished, well developed, in no acute distress.  HEENT: normal.  Neck: Supple, no JVD, carotid bruits, or masses. Cardiac: Irregularly irregular no murmurs, rubs, or gallops. No clubbing, cyanosis, edema.  Radials/DP/PT 2+ and equal bilaterally.  Respiratory:  Respirations regular and unlabored, clear to auscultation bilaterally. GI: Soft, nontender, nondistended, BS + x 4. MS: no deformity or atrophy. Skin: warm and dry, no rash. Neuro:  Strength and sensation are intact. Psych: Normal affect.  Accessory Clinical Findings    ECG - Atrial fibrillation, 66, left axis deviation, nonspecific T changes.   Lab Results  Component Value Date   CREATININE 1.10 (H) 10/24/2016   BUN 34 (H) 10/24/2016   NA 140 10/24/2016   K 4.6 10/24/2016   CL 112 (H)  10/24/2016   CO2 23 10/24/2016   Lab Results  Component Value Date   WBC 4.1 09/17/2016   HGB 13.3 09/17/2016   HCT 40.3 09/17/2016   MCV 95.2 09/17/2016   PLT 169 09/17/2016   Digoxin, Serum 0.8 (10/24/2016)  1.3 (11/18/2016)  Assessment & Plan    1.  Permanent atrial fibrillation: Patient is well rate controlled today after being initiated on digoxin therapy month ago. Her digoxin level is trending up at 1.3 on September 18. In that setting, given her frailty and age, I will reduce her dose to 62.5 g daily. She will remain on metoprolol and eliquis. I will follow-up a digoxin level within the next 7-10 days.   2. Essential hypertension: Blood pressure is well controlled on beta blocker therapy.  3. Disposition: Follow-up digoxin level in 7-10 days. Follow-up with Dr. Saunders Revel in 3-4 mos.  Murray Hodgkins, NP 11/20/2016, 2:35 PM

## 2016-11-20 NOTE — Telephone Encounter (Signed)
Rx called in to pharmacy. 

## 2016-11-21 ENCOUNTER — Other Ambulatory Visit: Payer: Self-pay | Admitting: Internal Medicine

## 2016-11-25 ENCOUNTER — Other Ambulatory Visit: Payer: Medicare Other

## 2016-11-28 ENCOUNTER — Other Ambulatory Visit (INDEPENDENT_AMBULATORY_CARE_PROVIDER_SITE_OTHER): Payer: Medicare Other

## 2016-11-28 DIAGNOSIS — R7889 Finding of other specified substances, not normally found in blood: Secondary | ICD-10-CM

## 2016-11-28 DIAGNOSIS — I482 Chronic atrial fibrillation: Secondary | ICD-10-CM | POA: Diagnosis not present

## 2016-11-28 NOTE — Addendum Note (Signed)
Addended by: Ellamae Sia on: 11/28/2016 09:03 AM   Modules accepted: Orders

## 2016-11-28 NOTE — Addendum Note (Signed)
Addended by: Ellamae Sia on: 11/28/2016 09:04 AM   Modules accepted: Orders

## 2016-11-29 LAB — DIGOXIN LEVEL: Digoxin Level: 0.8 mcg/L (ref 0.8–2.0)

## 2016-12-01 ENCOUNTER — Other Ambulatory Visit: Payer: Self-pay

## 2016-12-01 DIAGNOSIS — I4821 Permanent atrial fibrillation: Secondary | ICD-10-CM

## 2016-12-12 ENCOUNTER — Other Ambulatory Visit (INDEPENDENT_AMBULATORY_CARE_PROVIDER_SITE_OTHER): Payer: Medicare Other

## 2016-12-12 DIAGNOSIS — I4821 Permanent atrial fibrillation: Secondary | ICD-10-CM

## 2016-12-12 DIAGNOSIS — I482 Chronic atrial fibrillation: Secondary | ICD-10-CM | POA: Diagnosis not present

## 2016-12-13 LAB — DIGOXIN LEVEL: DIGOXIN LVL: 0.9 ug/L (ref 0.8–2.0)

## 2016-12-16 ENCOUNTER — Other Ambulatory Visit: Payer: Self-pay | Admitting: Internal Medicine

## 2016-12-16 NOTE — Telephone Encounter (Signed)
Last filled 11/18/16... Please advise 

## 2016-12-17 DIAGNOSIS — Z23 Encounter for immunization: Secondary | ICD-10-CM | POA: Diagnosis not present

## 2016-12-17 DIAGNOSIS — H401132 Primary open-angle glaucoma, bilateral, moderate stage: Secondary | ICD-10-CM | POA: Diagnosis not present

## 2016-12-17 NOTE — Telephone Encounter (Signed)
Ok to phone in Ultram

## 2016-12-18 NOTE — Telephone Encounter (Signed)
Rx called in to pharmacy. 

## 2017-01-06 DIAGNOSIS — N760 Acute vaginitis: Secondary | ICD-10-CM | POA: Diagnosis not present

## 2017-01-06 DIAGNOSIS — B372 Candidiasis of skin and nail: Secondary | ICD-10-CM | POA: Diagnosis not present

## 2017-01-06 DIAGNOSIS — N9089 Other specified noninflammatory disorders of vulva and perineum: Secondary | ICD-10-CM | POA: Diagnosis not present

## 2017-01-12 ENCOUNTER — Other Ambulatory Visit: Payer: Self-pay | Admitting: Internal Medicine

## 2017-01-14 ENCOUNTER — Other Ambulatory Visit: Payer: Self-pay | Admitting: Internal Medicine

## 2017-01-15 NOTE — Telephone Encounter (Signed)
Ok to phone in Tramadol to be filled on or after 01/17/17

## 2017-01-15 NOTE — Telephone Encounter (Signed)
Last filled 12/17/16--please advise

## 2017-02-04 ENCOUNTER — Other Ambulatory Visit: Payer: Self-pay | Admitting: Internal Medicine

## 2017-02-05 NOTE — Telephone Encounter (Signed)
Please advise if okay to refill Salagen

## 2017-02-13 ENCOUNTER — Other Ambulatory Visit: Payer: Self-pay | Admitting: Internal Medicine

## 2017-02-13 NOTE — Telephone Encounter (Signed)
Tramadol last filled 01/16/17... Please advise

## 2017-02-13 NOTE — Telephone Encounter (Signed)
Rx called in to pharmacy. 

## 2017-02-13 NOTE — Telephone Encounter (Signed)
Ok to phone in Tramadol to be filled on or after 12/16

## 2017-02-19 ENCOUNTER — Ambulatory Visit: Payer: Medicare Other | Admitting: Internal Medicine

## 2017-03-17 ENCOUNTER — Other Ambulatory Visit: Payer: Self-pay | Admitting: Internal Medicine

## 2017-03-18 NOTE — Telephone Encounter (Signed)
Tramadol last filled 02/13/17... Please advise

## 2017-03-30 NOTE — Progress Notes (Signed)
Follow-up Outpatient Visit Date: 04/01/2017  Primary Care Provider: Jearld Fenton, NP [address] [place] [address]  Chief Complaint: Follow-up atrial fibrillation  HPI:  Peggy Carter is a 84 y.o. year-old female with history of permanent atrial fibrillation, hypertension, and COPD, who presents for follow-up of atrial fibrillation. I last saw her in July, which time he agreed to increase her metoprolol due to suboptimal rate control. She subsequently saw Christell Faith, PA, in 10/2016. She was started on digoxin. This was decreased to 62.5 mcg daily when evaluated by Ignacia Bayley, NP, in 11/2016. Most recent digoxin level in 12/2016 was 0.9.  Today, Peggy Carter reports feeling well, denying palpitations, lightheadedness, chest pain, shortness of breath and edema. She has fallen once since her last visit with [place]; she slipped on a bed sheet while trying to tuck it in. She has not noticed any significant bleeding. She remains compliant with her medications, including apixaban, digoxin, and metoprolol. She is planning to visit her daughter in [place] this spring and may spend several weeks/months there.  --------------------------------------------------------------------------------------------------  Cardiovascular History & Procedures: Cardiovascular Problems:  Permanent atrial fibrillation  Risk Factors:  Hypertension and age > 9  Cath/PCI:  None  CV Surgery:  None  EP Procedures and Devices:  Holter monitor (05/31/15): Atrial fibrillation with average rate of 82 bpm (range 49-1 43 bpm). Occasional PVCs and or aberrancy.  Non-Invasive Evaluation(s):  TTE (05/31/15): Normal LV size and function with EF of 50-55% and normal wall motion. Mild MR. Mild left atrial enlargement. Normal RV size and function. Mild to moderate TR with mild to moderate pulmonary hypertension.  Pharmacologic MPI (05/18/15): Low risk study without ischemia or scar. LVEF 55-65%.  Recent  CV Pertinent Labs: Lab Results  Component Value Date   CHOL 142 10/18/2015   HDL 46.30 10/18/2015   LDLCALC 80 10/18/2015   TRIG 76.0 10/18/2015   CHOLHDL 3 10/18/2015   INR 1.21 03/29/2015   K 4.6 10/24/2016   K 3.5 06/06/2012   MG 2.1 09/17/2016   BUN 34 (H) 10/24/2016   BUN 34 (H) 10/20/2016   BUN 14 06/06/2012   CREATININE 1.10 (H) 10/24/2016   CREATININE 0.97 06/06/2012    Past medical and surgical history were reviewed and updated in EPIC.  Current Meds  Medication Sig  . albuterol (PROVENTIL HFA;VENTOLIN HFA) 108 (90 Base) MCG/ACT inhaler Inhale 2 puffs into the lungs every 6 (six) hours.  . brimonidine (ALPHAGAN P) 0.1 % SOLN Apply 2 drops to eye 2 (two) times daily.   . budesonide-formoterol (SYMBICORT) 160-4.5 MCG/ACT inhaler INHALE 2 PUFFS INTO THE LUNGS TWICE DAILY  . ELIQUIS 2.5 MG TABS tablet TAKE 1 TABLET(2.5 MG) BY MOUTH TWICE DAILY  . nystatin (MYCOSTATIN) 100000 UNIT/ML suspension Take 5 mLs (500,000 Units total) by mouth 4 (four) times daily.  Marland Kitchen omeprazole (PRILOSEC) 20 MG capsule TAKE 1 CAPSULE(20 MG) BY MOUTH TWICE DAILY BEFORE A MEAL  . PARoxetine (PAXIL) 10 MG tablet TAKE 1 TABLET(10 MG) BY MOUTH DAILY  . pilocarpine (SALAGEN) 5 MG tablet TAKE 1 TABLET(5 MG) BY MOUTH TWICE DAILY  . potassium chloride (K-DUR) 10 MEQ tablet Take 2 tablets (20 mEq total) by mouth daily.  . Probiotic Product (PROBIOTIC DAILY PO) Take 1 tablet by mouth daily.  . traMADol (ULTRAM) 50 MG tablet TAKE 1 TABLET BY MOUTH EVERY 8 HOURS AS NEEDED  . [DISCONTINUED] digoxin 62.5 MCG TABS Take 0.0625 mg by mouth daily.  . [DISCONTINUED] metoprolol tartrate (  LOPRESSOR) 25 MG tablet Take 1 tablet (25 mg total) by mouth 2 (two) times daily.    Allergies: Penicillins and Codeine  Social History   Socioeconomic History  . Marital status: Divorced    Spouse name: Not on file  . Number of children: Not on file  . Years of education: Not on file  . Highest education level: Not on file    Social Needs  . Financial resource strain: Not on file  . Food insecurity - worry: Not on file  . Food insecurity - inability: Not on file  . Transportation needs - medical: Not on file  . Transportation needs - non-medical: Not on file  Occupational History  . Not on file  Tobacco Use  . Smoking status: Never Smoker  . Smokeless tobacco: Never Used  Substance and Sexual Activity  . Alcohol use: No    Alcohol/week: 0.0 oz  . Drug use: No  . Sexual activity: Not on file  Other Topics Concern  . Not on file  Social History Narrative  . Not on file    Family History  Problem Relation Age of Onset  . Heart disease Mother   . Stroke Mother   . Mental illness Mother   . Emphysema Mother   . Lung cancer Father   . Arthritis Sister   . Alcohol abuse Paternal Aunt   . Alcohol abuse Paternal Uncle   . Diabetes Neg Hx     Review of Systems: A 12-system review of systems was performed and was negative except as noted in the HPI.  --------------------------------------------------------------------------------------------------  Physical Exam: BP (!) 148/70 (BP Location: Right Arm, Patient Position: Sitting, Cuff Size: Normal)   Pulse 86   Ht 4\' 11"  (1.499 m)   Wt 112 lb 8 oz (51 kg)   BMI 22.72 kg/m   General:  Thin, elderly woman, seated comfortably in the exam room. She is accompanied by her son. HEENT: No conjunctival pallor or scleral icterus. Moist mucous membranes.  OP clear. Neck: Supple without lymphadenopathy, thyromegaly, JVD, or HJR.  Lungs: Normal work of breathing. Clear to auscultation bilaterally without wheezes or crackles. Heart: Irregularly irregular without murmurs or rubs. Non-displaced PMI. Abd: Bowel sounds present. Soft, NT/ND without hepatosplenomegaly Ext: No lower extremity edema. Radial, PT, and DP pulses are 2+ bilaterally. Skin: Warm and dry with scattered ecchymoses on arms.  EKG:  Baseline artifact noted. Atrial fibrillation (ventricular  rate 86 bpm). Low voltage.  Lab Results  Component Value Date   WBC 4.1 09/17/2016   HGB 13.3 09/17/2016   HCT 40.3 09/17/2016   MCV 95.2 09/17/2016   PLT 169 09/17/2016    Lab Results  Component Value Date   NA 140 10/24/2016   K 4.6 10/24/2016   CL 112 (H) 10/24/2016   CO2 23 10/24/2016   BUN 34 (H) 10/24/2016   CREATININE 1.10 (H) 10/24/2016   GLUCOSE 64 (L) 10/24/2016   ALT 17 06/19/2016    Lab Results  Component Value Date   CHOL 142 10/18/2015   HDL 46.30 10/18/2015   LDLCALC 80 10/18/2015   TRIG 76.0 10/18/2015   CHOLHDL 3 10/18/2015    --------------------------------------------------------------------------------------------------  ASSESSMENT AND PLAN: Permanent atrial fibrillation Peggy Carter is asymptomatic and adequately rate controlled today. However, given her age and history of moderate renal impairment, I feel that it would be best to avoid long-term digoxin therapy. As her blood pressure today allows for uptitration of metoprolol, we have agreed to stop digoxin and  increase metoprolol tartrate to 50 mg daily. She will continue on apixaban 2.5 mg BID (reduced dose based on age and weight).  Hypertension Blood pressure mildly elevated today. As above, we will increase metoprolol to 50 mg BID.  Follow-up: Return to clinic in 3-4 weeks to reassess heart rate and blood pressure.  Nelva Bush, MD 04/01/2017 12:44 PM

## 2017-04-01 ENCOUNTER — Ambulatory Visit (INDEPENDENT_AMBULATORY_CARE_PROVIDER_SITE_OTHER): Payer: Medicare Other | Admitting: Internal Medicine

## 2017-04-01 ENCOUNTER — Encounter: Payer: Self-pay | Admitting: Internal Medicine

## 2017-04-01 VITALS — BP 148/70 | HR 86 | Ht 59.0 in | Wt 112.5 lb

## 2017-04-01 DIAGNOSIS — I10 Essential (primary) hypertension: Secondary | ICD-10-CM

## 2017-04-01 DIAGNOSIS — I482 Chronic atrial fibrillation: Secondary | ICD-10-CM

## 2017-04-01 DIAGNOSIS — I4821 Permanent atrial fibrillation: Secondary | ICD-10-CM

## 2017-04-01 MED ORDER — METOPROLOL TARTRATE 50 MG PO TABS: 50.0000 mg | ORAL_TABLET | Freq: Two times a day (BID) | ORAL | 3 refills | Status: AC

## 2017-04-01 NOTE — Patient Instructions (Signed)
Medication Instructions:  Your physician has recommended you make the following change in your medication:  1- STOP digoxin. 2- INCREASE Metoprolol to 50 mg by mouth two times a day.   Labwork: NONE  Testing/Procedures: NONE  Follow-Up: Your physician recommends that you schedule a follow-up appointment in: 3-4 [place].    If you need a refill on your cardiac medications before your next appointment, please call your pharmacy.

## 2017-04-13 ENCOUNTER — Other Ambulatory Visit: Payer: Self-pay | Admitting: Internal Medicine

## 2017-04-14 NOTE — Telephone Encounter (Signed)
Last filled 01/06/18

## 2017-04-17 ENCOUNTER — Telehealth: Payer: Self-pay | Admitting: Internal Medicine

## 2017-04-17 NOTE — Telephone Encounter (Signed)
Tramadol refill Last OV: 10/21/16 Last Refill:03/18/17 Pharmacy:[facility] [address]

## 2017-04-17 NOTE — Telephone Encounter (Signed)
Look in the chart, there is already a request sent to [facility]

## 2017-04-17 NOTE — Telephone Encounter (Signed)
Copied from [place]. Topic: Inquiry >> Apr 17, 2017 12:28 PM Pricilla Handler wrote: Reason for CRM: Patient called requesting TraMADol (ULTRAM) 50 MG tablet refill to be sent to [place], [place] - [address] AT [address] [phone] (Phone) [phone] (Fax).

## 2017-04-23 ENCOUNTER — Ambulatory Visit: Payer: Medicare Other | Admitting: Physician Assistant

## 2017-05-08 ENCOUNTER — Other Ambulatory Visit: Payer: Self-pay | Admitting: Internal Medicine

## 2017-05-10 ENCOUNTER — Other Ambulatory Visit: Payer: Self-pay | Admitting: Internal Medicine

## 2017-05-11 NOTE — Telephone Encounter (Signed)
Last filled 04/17/17... Note TBF on or after 05/15/17 added... Please advise

## 2017-05-11 NOTE — Telephone Encounter (Signed)
Salagen last filled 02/06/2017... Please advise

## 2017-06-12 ENCOUNTER — Other Ambulatory Visit: Payer: Self-pay | Admitting: Internal Medicine

## 2017-06-15 NOTE — Telephone Encounter (Signed)
Baity pt.Marland KitchenMarland KitchenLast filled for 05/15/17 390... Last OV 10/2016 for wellness... Please advise

## 2017-06-26 ENCOUNTER — Encounter: Payer: Self-pay | Admitting: Internal Medicine

## 2017-07-07 ENCOUNTER — Other Ambulatory Visit: Payer: Self-pay | Admitting: Internal Medicine

## 2017-07-13 ENCOUNTER — Other Ambulatory Visit: Payer: Self-pay | Admitting: Internal Medicine

## 2017-07-14 NOTE — Telephone Encounter (Signed)
Last filled 06-15-17 #90

## 2017-07-24 ENCOUNTER — Encounter: Payer: Medicare Other | Admitting: Internal Medicine

## 2017-08-10 ENCOUNTER — Other Ambulatory Visit: Payer: Self-pay | Admitting: Internal Medicine

## 2017-08-11 NOTE — Telephone Encounter (Signed)
Last filled 07/14/17 #90... Not to pharmacy to be filled on or after 08/14/17... please advise

## 2017-09-07 ENCOUNTER — Other Ambulatory Visit: Payer: Self-pay | Admitting: Internal Medicine

## 2017-09-08 NOTE — Telephone Encounter (Signed)
Name of Medication: Tramadol 50mg  Name of Pharmacy: [facility] or Written Date and Quantity: 08/11/17 #90  Last Office Visit and Type: 10/2016 AWV Next Office Visit and Type: 11/2017 AWV Last Controlled Substance Agreement Date: 10/2016 Last UDS:08/2015

## 2017-09-15 DIAGNOSIS — L929 Granulomatous disorder of the skin and subcutaneous tissue, unspecified: Secondary | ICD-10-CM | POA: Diagnosis not present

## 2017-09-15 DIAGNOSIS — D485 Neoplasm of uncertain behavior of skin: Secondary | ICD-10-CM | POA: Diagnosis not present

## 2017-09-15 DIAGNOSIS — L853 Xerosis cutis: Secondary | ICD-10-CM | POA: Diagnosis not present

## 2017-09-23 DIAGNOSIS — L309 Dermatitis, unspecified: Secondary | ICD-10-CM | POA: Diagnosis not present

## 2017-09-23 DIAGNOSIS — L98499 Non-pressure chronic ulcer of skin of other sites with unspecified severity: Secondary | ICD-10-CM | POA: Diagnosis not present

## 2017-09-23 DIAGNOSIS — D485 Neoplasm of uncertain behavior of skin: Secondary | ICD-10-CM | POA: Diagnosis not present

## 2017-10-05 ENCOUNTER — Other Ambulatory Visit: Payer: Self-pay | Admitting: Internal Medicine

## 2017-10-05 DIAGNOSIS — S59912A Unspecified injury of left forearm, initial encounter: Secondary | ICD-10-CM | POA: Diagnosis not present

## 2017-10-05 DIAGNOSIS — S12110A Anterior displaced Type II dens fracture, initial encounter for closed fracture: Secondary | ICD-10-CM | POA: Diagnosis not present

## 2017-10-05 DIAGNOSIS — I6782 Cerebral ischemia: Secondary | ICD-10-CM | POA: Diagnosis not present

## 2017-10-05 DIAGNOSIS — S49092A Other physeal fracture of upper end of humerus, left arm, initial encounter for closed fracture: Secondary | ICD-10-CM | POA: Diagnosis not present

## 2017-10-05 DIAGNOSIS — Z7901 Long term (current) use of anticoagulants: Secondary | ICD-10-CM | POA: Diagnosis not present

## 2017-10-05 DIAGNOSIS — S42352A Displaced comminuted fracture of shaft of humerus, left arm, initial encounter for closed fracture: Secondary | ICD-10-CM | POA: Diagnosis not present

## 2017-10-05 DIAGNOSIS — S62615A Displaced fracture of proximal phalanx of left ring finger, initial encounter for closed fracture: Secondary | ICD-10-CM | POA: Diagnosis not present

## 2017-10-05 DIAGNOSIS — S42292A Other displaced fracture of upper end of left humerus, initial encounter for closed fracture: Secondary | ICD-10-CM | POA: Diagnosis not present

## 2017-10-05 DIAGNOSIS — Z79899 Other long term (current) drug therapy: Secondary | ICD-10-CM | POA: Diagnosis not present

## 2017-10-05 DIAGNOSIS — S62391A Other fracture of second metacarpal bone, left hand, initial encounter for closed fracture: Secondary | ICD-10-CM | POA: Diagnosis not present

## 2017-10-05 DIAGNOSIS — R079 Chest pain, unspecified: Secondary | ICD-10-CM | POA: Diagnosis not present

## 2017-10-05 DIAGNOSIS — M79632 Pain in left forearm: Secondary | ICD-10-CM | POA: Diagnosis not present

## 2017-10-05 DIAGNOSIS — I4891 Unspecified atrial fibrillation: Secondary | ICD-10-CM | POA: Diagnosis not present

## 2017-10-05 DIAGNOSIS — S52572A Other intraarticular fracture of lower end of left radius, initial encounter for closed fracture: Secondary | ICD-10-CM | POA: Diagnosis not present

## 2017-10-05 DIAGNOSIS — S0990XA Unspecified injury of head, initial encounter: Secondary | ICD-10-CM | POA: Diagnosis not present

## 2017-10-05 DIAGNOSIS — I10 Essential (primary) hypertension: Secondary | ICD-10-CM | POA: Diagnosis not present

## 2017-10-05 DIAGNOSIS — M1990 Unspecified osteoarthritis, unspecified site: Secondary | ICD-10-CM | POA: Diagnosis not present

## 2017-10-05 DIAGNOSIS — S59902A Unspecified injury of left elbow, initial encounter: Secondary | ICD-10-CM | POA: Diagnosis not present

## 2017-10-05 DIAGNOSIS — S299XXA Unspecified injury of thorax, initial encounter: Secondary | ICD-10-CM | POA: Diagnosis not present

## 2017-10-05 DIAGNOSIS — S199XXA Unspecified injury of neck, initial encounter: Secondary | ICD-10-CM | POA: Diagnosis not present

## 2017-10-05 DIAGNOSIS — S62321A Displaced fracture of shaft of second metacarpal bone, left hand, initial encounter for closed fracture: Secondary | ICD-10-CM | POA: Diagnosis not present

## 2017-10-05 DIAGNOSIS — Z880 Allergy status to penicillin: Secondary | ICD-10-CM | POA: Diagnosis not present

## 2017-10-05 DIAGNOSIS — S12100A Unspecified displaced fracture of second cervical vertebra, initial encounter for closed fracture: Secondary | ICD-10-CM | POA: Diagnosis not present

## 2017-10-05 DIAGNOSIS — M542 Cervicalgia: Secondary | ICD-10-CM | POA: Diagnosis not present

## 2017-10-05 DIAGNOSIS — M25522 Pain in left elbow: Secondary | ICD-10-CM | POA: Diagnosis not present

## 2017-10-07 NOTE — Telephone Encounter (Signed)
Name of Medication: Tramadol 50mg  Name of Pharmacy: [facility] or Written Date and Quantity: 09/08/2017 #90 Last Office Visit and Type: 10/2016 AWV Next Office Visit and Type: 11/2017 AWV Last Controlled Substance Agreement Date: 10/2016

## 2017-10-12 DIAGNOSIS — M542 Cervicalgia: Secondary | ICD-10-CM | POA: Diagnosis not present

## 2017-10-14 DIAGNOSIS — S42202A Unspecified fracture of upper end of left humerus, initial encounter for closed fracture: Secondary | ICD-10-CM | POA: Diagnosis not present

## 2017-10-14 DIAGNOSIS — M79642 Pain in left hand: Secondary | ICD-10-CM | POA: Diagnosis not present

## 2017-10-14 DIAGNOSIS — S62619A Displaced fracture of proximal phalanx of unspecified finger, initial encounter for closed fracture: Secondary | ICD-10-CM | POA: Diagnosis not present

## 2017-10-14 DIAGNOSIS — M25512 Pain in left shoulder: Secondary | ICD-10-CM | POA: Diagnosis not present

## 2017-10-26 ENCOUNTER — Encounter: Payer: Medicare Other | Admitting: Internal Medicine

## 2017-10-26 DIAGNOSIS — M79642 Pain in left hand: Secondary | ICD-10-CM | POA: Diagnosis not present

## 2017-10-26 DIAGNOSIS — M25512 Pain in left shoulder: Secondary | ICD-10-CM | POA: Diagnosis not present

## 2017-10-26 DIAGNOSIS — S62619A Displaced fracture of proximal phalanx of unspecified finger, initial encounter for closed fracture: Secondary | ICD-10-CM | POA: Diagnosis not present

## 2017-10-26 DIAGNOSIS — S42202A Unspecified fracture of upper end of left humerus, initial encounter for closed fracture: Secondary | ICD-10-CM | POA: Diagnosis not present

## 2017-10-29 DIAGNOSIS — M810 Age-related osteoporosis without current pathological fracture: Secondary | ICD-10-CM | POA: Diagnosis not present

## 2017-11-04 ENCOUNTER — Other Ambulatory Visit: Payer: Self-pay | Admitting: Internal Medicine

## 2017-11-05 NOTE — Telephone Encounter (Signed)
Tramadol last filled 10/07/2017... TBF on or after 11/07/17.Marland KitchenMarland KitchenPlease advise

## 2017-11-06 ENCOUNTER — Encounter: Payer: Self-pay | Admitting: Internal Medicine

## 2017-11-06 ENCOUNTER — Ambulatory Visit (INDEPENDENT_AMBULATORY_CARE_PROVIDER_SITE_OTHER): Payer: Medicare Other | Admitting: Internal Medicine

## 2017-11-06 VITALS — BP 112/64 | HR 76 | Temp 97.8°F | Wt 98.0 lb

## 2017-11-06 DIAGNOSIS — S42295G Other nondisplaced fracture of upper end of left humerus, subsequent encounter for fracture with delayed healing: Secondary | ICD-10-CM

## 2017-11-06 DIAGNOSIS — S62609A Fracture of unspecified phalanx of unspecified finger, initial encounter for closed fracture: Secondary | ICD-10-CM | POA: Diagnosis not present

## 2017-11-06 NOTE — Progress Notes (Signed)
Subjective:    Patient ID: Peggy Carter, female    DOB: 09/20/1929, 84 y.o.   MRN: [id number]  HPI  Pt presents to the clinic today for ER follow up. She reports she was at her daughters house in [place]. She was opening the fridge, she pulled it open, it hit her in the face and she landed on her left side.   Xray of Left Hand showed:  INTERPRETATION:   There is a comminuted intra-articular fracture of the proximal fourth proximal phalanx extending into the MCP joint.  There is an oblique spiral type fracture in the second metacarpal in its midportion.  Osteopenia noted. Advanced degenerative change first CMC joint and STT joint.  Prior internal fixation of the distal radius noted. Degenerative change of the DIP joint noted. Soft tissue swelling surrounding the MCP joints on lateral radiograph.   Xray Left Shoulder showed:  INTERPRETATION:   There is a comminuted, displaced fracture of the proximal humeral diaphysis presents. Some mild angulation at the fracture site is noted. Advanced degenerative change of the glenohumeral joint noted. Glenohumeral joint appears located however axillary view would be helpful. Chronic appearing rotator cuff tear likely present with high riding humeral head. Moderate to advanced degenerative change at [facility] joint.  Soft tissues are unremarkable.   CT Head showed chronic small vessel disease but no acute bleed.   CT Neck showed:  Impression:  IMPRESSION: Fracture through the base of the dens with mild posterior angulation of the dens, and mild narrowing of the upper cervical spinal canal, probably not acute but new compared to March 2018.   She was put in a spling and a sling. She was discharged with Lidoderm patches, which she reports she is not taking because it is not effective. She is taking Tylenol 500 mg TID along with scheduled Tramadol which is providing good pain relief.   Review of Systems      Past Medical History:  Diagnosis  Date  . Acid reflux   . Allergy   . Arthritis   . Asthma   . Blood transfusion without reported diagnosis   . Depression   . Glaucoma   . History of nuclear stress test    a. 05/2015 low risk without ischemia or scar, EF 55-65%  . History of stomach ulcers   . Hypertension   . Permanent atrial fibrillation (HCC)    a. on Eliquis; b. CHADS2VASc => 4 (HTN, age x 2, female); c. Holter 05/2015 showed Afib w/ avg HR 80 bpm (range 49-143 bpm), occasional PVCs  . Pulmonary hypertension ([place])    a. TTE 05/2015: EF 50-55%, nl wall motion, mild MR, mild LAE, nl RV size & sys fxn, mild to mod TR, mild to mod pulm htn w/ PASP 47 mmHg  . Sjoegren syndrome   . Tricuspid regurgitation    a. 05/2015 Echo: mild to mod TR in setting of mild to mod PAH.    Current Outpatient Medications  Medication Sig Dispense Refill  . albuterol (PROVENTIL HFA;VENTOLIN HFA) 108 (90 Base) MCG/ACT inhaler Inhale 2 puffs into the lungs every 6 (six) hours.    . brimonidine (ALPHAGAN P) 0.1 % SOLN Apply 2 drops to eye 2 (two) times daily.     . budesonide-formoterol (SYMBICORT) 160-4.5 MCG/ACT inhaler INHALE 2 PUFFS INTO THE LUNGS TWICE DAILY 10.2 g 0  . ELIQUIS 2.5 MG TABS tablet TAKE 1 TABLET(2.5 MG) BY MOUTH TWICE DAILY 180 tablet 0  . lidocaine (LIDODERM) 5 %  PLACE ONE PATCH TOPICALLY TO THE PAINFUL AREA ONCE D FOR FIVE DAYS PRN P. REMOVE AND DISCARD PATCH WITHIN 12 HOURS.  0  . nystatin (MYCOSTATIN) 100000 UNIT/ML suspension Take 5 mLs (500,000 Units total) by mouth 4 (four) times daily. 200 mL 0  . omeprazole (PRILOSEC) 20 MG capsule TAKE 1 CAPSULE(20 MG) BY MOUTH TWICE DAILY BEFORE A MEAL 180 capsule 0  . PARoxetine (PAXIL) 10 MG tablet TAKE 1 TABLET(10 MG) BY MOUTH DAILY 90 tablet 0  . pilocarpine (SALAGEN) 5 MG tablet TAKE 1 TABLET(5 MG) BY MOUTH TWICE DAILY 180 tablet 0  . potassium chloride (K-DUR) 10 MEQ tablet Take 2 tablets (20 mEq total) by mouth daily. 60 tablet 2  . Probiotic Product (PROBIOTIC DAILY PO)  Take 1 tablet by mouth daily.    . traMADol (ULTRAM) 50 MG tablet TAKE 1 TABLET BY MOUTH EVERY 8 HOURS AS NEEDED 90 tablet 0  . metoprolol tartrate (LOPRESSOR) 50 MG tablet Take 1 tablet (50 mg total) by mouth 2 (two) times daily. 180 tablet 3   No current facility-administered medications for this visit.     Allergies  Allergen Reactions  . Penicillins Anaphylaxis and Hives    States "almost died" Has patient had a PCN reaction causing immediate rash, facial/tongue/throat swelling, SOB or lightheadedness with hypotension: Yes Has patient had a PCN reaction causing severe rash involving mucus membranes or skin necrosis: No Has patient had a PCN reaction that required hospitalization No Has patient had a PCN reaction occurring within the last 10 years: No If all of the above answers are "NO", then may proceed with Cephalosporin use.  . Codeine Hives    Family History  Problem Relation Age of Onset  . Heart disease Mother   . Stroke Mother   . Mental illness Mother   . Emphysema Mother   . Lung cancer Father   . Arthritis Sister   . Alcohol abuse Paternal Aunt   . Alcohol abuse Paternal Uncle   . Diabetes Neg Hx     Social History   Socioeconomic History  . Marital status: Divorced    Spouse name: Not on file  . Number of children: Not on file  . Years of education: Not on file  . Highest education level: Not on file  Occupational History  . Not on file  Social Needs  . Financial resource strain: Not on file  . Food insecurity:    Worry: Not on file    Inability: Not on file  . Transportation needs:    Medical: Not on file    Non-medical: Not on file  Tobacco Use  . Smoking status: Never Smoker  . Smokeless tobacco: Never Used  Substance and Sexual Activity  . Alcohol use: No    Alcohol/week: 0.0 standard drinks  . Drug use: No  . Sexual activity: Not on file  Lifestyle  . Physical activity:    Days per week: Not on file    Minutes per session: Not on file    . Stress: Not on file  Relationships  . Social connections:    Talks on phone: Not on file    Gets together: Not on file    Attends religious service: Not on file    Active member of club or organization: Not on file    Attends meetings of clubs or organizations: Not on file    Relationship status: Not on file  . Intimate partner violence:    Fear of current  or ex partner: Not on file    Emotionally abused: Not on file    Physically abused: Not on file    Forced sexual activity: Not on file  Other Topics Concern  . Not on file  Social History Narrative  . Not on file     Constitutional: Denies fever, malaise, fatigue, headache or abrupt weight changes.  Musculoskeletal: Pt reports left upper arm pain, left hand pain. Denies difficulty with gait, muscle pain.  Skin: Pt reports bruising to left hand. Denies redness, rashes, lesions or ulcercations.    No other specific complaints in a complete review of systems (except as listed in HPI above).  Objective:   Physical Exam  BP 112/64   Pulse 76   Temp 97.8 F (36.6 C) (Oral)   Wt 98 lb (44.5 kg)   BMI 19.79 kg/m  Wt Readings from Last 3 Encounters:  11/06/17 98 lb (44.5 kg)  04/01/17 112 lb 8 oz (51 kg)  11/20/16 102 lb 8 oz (46.5 kg)    General: Appears her stated age, in NAD. Skin: Bruising noted over posterior left hand.  CV: Radial pulses 2+.  Musculoskeletal: Pain and swelling noted of the left upper humerus. Did not remove hand splint. Walks with rolling walker. Neurological: Sensation intact to BUE.   BMET    Component Value Date/Time   NA 140 10/24/2016 0915   NA 139 10/20/2016 1425   NA 142 06/06/2012 1322   K 4.6 10/24/2016 0915   K 3.5 06/06/2012 1322   CL 112 (H) 10/24/2016 0915   CL 114 (H) 06/06/2012 1322   CO2 23 10/24/2016 0915   CO2 21 06/06/2012 1322   GLUCOSE 64 (L) 10/24/2016 0915   GLUCOSE 95 06/06/2012 1322   BUN 34 (H) 10/24/2016 0915   BUN 34 (H) 10/20/2016 1425   BUN 14  06/06/2012 1322   CREATININE 1.10 (H) 10/24/2016 0915   CREATININE 0.97 06/06/2012 1322   CALCIUM 10.4 (H) 10/24/2016 0915   CALCIUM 9.8 06/06/2012 1322   GFRNONAA 44 (L) 10/24/2016 0915   GFRNONAA 55 (L) 06/06/2012 1322   GFRAA 52 (L) 10/24/2016 0915   GFRAA >60 06/06/2012 1322    Lipid Panel     Component Value Date/Time   CHOL 142 10/18/2015 0858   TRIG 76.0 10/18/2015 0858   HDL 46.30 10/18/2015 0858   CHOLHDL 3 10/18/2015 0858   VLDL 15.2 10/18/2015 0858   LDLCALC 80 10/18/2015 0858    CBC    Component Value Date/Time   WBC 4.1 09/17/2016 1608   RBC 4.23 09/17/2016 1608   HGB 13.3 09/17/2016 1608   HGB 10.9 (L) 06/06/2012 1322   HCT 40.3 09/17/2016 1608   HCT 33.4 (L) 06/06/2012 1322   PLT 169 09/17/2016 1608   PLT 404 06/06/2012 1322   MCV 95.2 09/17/2016 1608   MCV 82 06/06/2012 1322   MCH 31.4 09/17/2016 1608   MCHC 32.9 09/17/2016 1608   RDW 13.7 09/17/2016 1608   RDW 15.8 (H) 06/06/2012 1322   LYMPHSABS 1.1 09/17/2016 1608   MONOABS 0.3 09/17/2016 1608   EOSABS 0.0 09/17/2016 1608   BASOSABS 0.1 09/17/2016 1608    Hgb A1C No results found for: HGBA1C          Assessment & Plan:   ER Follow Up for Left Humeral Head Fracture, Left Hand Fracture:  ER notes, labs and imaging reviewed Continue sling and hand splint Continue Tylenol and Tramadol Referral placed to ortho for further  evaluation Ice may be helpful  Return precautions discussed Webb Silversmith, NP

## 2017-11-08 ENCOUNTER — Encounter: Payer: Self-pay | Admitting: Internal Medicine

## 2017-11-08 NOTE — Patient Instructions (Signed)
How to Use a Sling A sling is a type of hanging bandage. You wear it around your neck to protect an injured arm, shoulder, or other body part. You may need to wear a sling to keep you from moving the injured body part while it heals. Keeping the injured part of your body still reduces pain and speeds up healing. Your doctor may suggest you use a sling if you have:  A broken arm.  A broken collarbone.  A shoulder injury.  Surgery.  What are the risks? Wearing a sling the wrong way can:  Make your injury worse.  Cause stiffness or numbness.  Affect blood circulation in your arm and hand. This can cause tingling or numbness in your fingers or hands.  How to use a sling The way that you should use a sling depends on your injury. It is important that you follow all of your doctor's instructions for your injury. Also follow these general suggestions:  Wear the sling so that your arm bends 90 degrees at the elbow. That is like a right angle or the shape of a capital letter "L." The sling should also support your wrist and hand.  Try not to move your arm.  Do not lie down flat on your back while you have to wear a sling. Sleep in a recliner or use pillows to raise your upper body in bed.  Do not twist, raise, or move your arm in a way that could make your injury worse.  Do not lean on your arm while you have to wear a sling.  Do not lift anything while you have to wear a sling.  Get help if:  You have bruising, swelling, or pain that is getting worse.  Your pain medicine is not helping.  You have a fever. Get help right away if:  Your fingers are numb or tingling.  Your fingers turn blue or feel cold to the touch.  You cannot control the bleeding from your injury.  You are short of breath. This information is not intended to replace advice given to you by your health care provider. Make sure you discuss any questions you have with your health care provider. Document  Released: 05/14/2009 Document Revised: 07/26/2015 Document Reviewed: 12/21/2013 Elsevier Interactive Patient Education  [employer].

## 2017-11-12 DIAGNOSIS — S42332A Displaced oblique fracture of shaft of humerus, left arm, initial encounter for closed fracture: Secondary | ICD-10-CM | POA: Diagnosis not present

## 2017-11-12 DIAGNOSIS — M25512 Pain in left shoulder: Secondary | ICD-10-CM | POA: Diagnosis not present

## 2017-11-12 DIAGNOSIS — S62321A Displaced fracture of shaft of second metacarpal bone, left hand, initial encounter for closed fracture: Secondary | ICD-10-CM | POA: Diagnosis not present

## 2017-11-12 DIAGNOSIS — M79642 Pain in left hand: Secondary | ICD-10-CM | POA: Diagnosis not present

## 2017-11-13 DIAGNOSIS — M25512 Pain in left shoulder: Secondary | ICD-10-CM | POA: Diagnosis not present

## 2017-11-13 DIAGNOSIS — M79642 Pain in left hand: Secondary | ICD-10-CM | POA: Diagnosis not present

## 2017-11-13 DIAGNOSIS — M810 Age-related osteoporosis without current pathological fracture: Secondary | ICD-10-CM | POA: Diagnosis not present

## 2017-11-30 ENCOUNTER — Encounter: Payer: Self-pay | Admitting: Internal Medicine

## 2017-11-30 ENCOUNTER — Ambulatory Visit (INDEPENDENT_AMBULATORY_CARE_PROVIDER_SITE_OTHER): Payer: Medicare Other | Admitting: Internal Medicine

## 2017-11-30 ENCOUNTER — Telehealth: Payer: Self-pay | Admitting: Internal Medicine

## 2017-11-30 VITALS — BP 104/66 | HR 88 | Temp 97.6°F | Ht 59.0 in | Wt 96.0 lb

## 2017-11-30 DIAGNOSIS — Z136 Encounter for screening for cardiovascular disorders: Secondary | ICD-10-CM | POA: Diagnosis not present

## 2017-11-30 DIAGNOSIS — K219 Gastro-esophageal reflux disease without esophagitis: Secondary | ICD-10-CM | POA: Diagnosis not present

## 2017-11-30 DIAGNOSIS — F32A Depression, unspecified: Secondary | ICD-10-CM

## 2017-11-30 DIAGNOSIS — F329 Major depressive disorder, single episode, unspecified: Secondary | ICD-10-CM | POA: Diagnosis not present

## 2017-11-30 DIAGNOSIS — I482 Chronic atrial fibrillation: Secondary | ICD-10-CM | POA: Diagnosis not present

## 2017-11-30 DIAGNOSIS — E441 Mild protein-calorie malnutrition: Secondary | ICD-10-CM | POA: Diagnosis not present

## 2017-11-30 DIAGNOSIS — I4821 Permanent atrial fibrillation: Secondary | ICD-10-CM

## 2017-11-30 DIAGNOSIS — E559 Vitamin D deficiency, unspecified: Secondary | ICD-10-CM | POA: Diagnosis not present

## 2017-11-30 DIAGNOSIS — T8149XA Infection following a procedure, other surgical site, initial encounter: Secondary | ICD-10-CM | POA: Diagnosis not present

## 2017-11-30 DIAGNOSIS — J4520 Mild intermittent asthma, uncomplicated: Secondary | ICD-10-CM | POA: Diagnosis not present

## 2017-11-30 DIAGNOSIS — Z0000 Encounter for general adult medical examination without abnormal findings: Secondary | ICD-10-CM

## 2017-11-30 DIAGNOSIS — Z23 Encounter for immunization: Secondary | ICD-10-CM | POA: Diagnosis not present

## 2017-11-30 DIAGNOSIS — M3500 Sicca syndrome, unspecified: Secondary | ICD-10-CM

## 2017-11-30 DIAGNOSIS — M1990 Unspecified osteoarthritis, unspecified site: Secondary | ICD-10-CM | POA: Diagnosis not present

## 2017-11-30 DIAGNOSIS — I10 Essential (primary) hypertension: Secondary | ICD-10-CM | POA: Diagnosis not present

## 2017-11-30 LAB — COMPREHENSIVE METABOLIC PANEL
ALBUMIN: 3.5 g/dL (ref 3.5–5.2)
ALT: 8 U/L (ref 0–35)
AST: 20 U/L (ref 0–37)
Alkaline Phosphatase: 94 U/L (ref 39–117)
BUN: 24 mg/dL — ABNORMAL HIGH (ref 6–23)
CALCIUM: 9.1 mg/dL (ref 8.4–10.5)
CO2: 21 mEq/L (ref 19–32)
CREATININE: 1.19 mg/dL (ref 0.40–1.20)
Chloride: 110 mEq/L (ref 96–112)
GFR: 45.7 mL/min — ABNORMAL LOW (ref >60.00)
Glucose, Bld: 85 mg/dL (ref 70–99)
Potassium: 2.8 mEq/L — CL (ref 3.5–5.1)
Sodium: 140 mEq/L (ref 135–145)
Total Bilirubin: 0.4 mg/dL (ref 0.2–1.2)
Total Protein: 7.1 g/dL (ref 6.0–8.3)

## 2017-11-30 LAB — LIPID PANEL
CHOL/HDL RATIO: 3
Cholesterol: 109 mg/dL (ref 0–200)
HDL: 32.2 mg/dL — AB (ref >39.00)
LDL CALC: 58 mg/dL (ref 0–99)
NonHDL: 76.78
TRIGLYCERIDES: 94 mg/dL (ref 0.0–149.0)
VLDL: 18.8 mg/dL (ref 0.0–40.0)

## 2017-11-30 LAB — CBC
HCT: 34.2 % — ABNORMAL LOW (ref 36.0–46.0)
Hemoglobin: 11.4 g/dL — ABNORMAL LOW (ref 12.0–15.0)
MCHC: 33.3 g/dL (ref 30.0–36.0)
MCV: 98.6 fl (ref 78.0–100.0)
PLATELETS: 370 10*3/uL (ref 150.0–400.0)
RBC: 3.47 Mil/uL — AB (ref 3.87–5.11)
RDW: 14.4 % (ref 11.5–15.5)
WBC: 7.2 10*3/uL (ref 4.0–10.5)

## 2017-11-30 LAB — VITAMIN D 25 HYDROXY (VIT D DEFICIENCY, FRACTURES): VITD: 58.23 ng/mL (ref 30.00–100.00)

## 2017-11-30 MED ORDER — DOXYCYCLINE HYCLATE 100 MG PO TABS: 100.0000 mg | ORAL_TABLET | Freq: Two times a day (BID) | ORAL | 0 refills | Status: DC

## 2017-11-30 MED ORDER — MIRTAZAPINE 7.5 MG PO TABS: 7.5000 mg | ORAL_TABLET | Freq: Every day | ORAL | 5 refills | Status: AC

## 2017-11-30 NOTE — Progress Notes (Addendum)
HPI:  Pt presents to the clinic today for her Medicare Wellness Exam. She is also due to follow up chronic conditions.  GERD: She is not sure what triggers this. She denies breakthrough on Omeprazole. There is no upper GI on file.  OA: Generalized. She takes Ultram as prescribed. She reports it takes the edge off but does not take the pain away.  Asthma: She denies chronic cough, wheezing or shortness of breath. She takes her Symbicort as prescribed. She rarely uses Albuterol. There are no PFT's on file.  HTN: Her BP today is 104/66. She takes Metoprolol and Digoxin as prescribed. She follows with cardiology. ECG from 03/2017 reviewed.  Afib: Persistent. Currently managed on Metoprolol, Digoxin and Eliquis. She denies chest pain, chest tightness, palpitations. ECG from 03/2017 reviewed.  Depression: Chronic, but stable on Paroxetine. She denies anxiety, SI/HI.  Sjogren's Syndrome: Managed on Pilocarpine. She follows with Dr. Jefm Bryant.  Past Medical History:  Diagnosis Date  . Acid reflux   . Allergy   . Arthritis   . Asthma   . Blood transfusion without reported diagnosis   . Depression   . Glaucoma   . History of nuclear stress test    a. 05/2015 low risk without ischemia or scar, EF 55-65%  . History of stomach ulcers   . Hypertension   . Permanent atrial fibrillation (HCC)    a. on Eliquis; b. CHADS2VASc => 4 (HTN, age x 2, female); c. Holter 05/2015 showed Afib w/ avg HR 80 bpm (range 49-143 bpm), occasional PVCs  . Pulmonary hypertension ([place])    a. TTE 05/2015: EF 50-55%, nl wall motion, mild MR, mild LAE, nl RV size & sys fxn, mild to mod TR, mild to mod pulm htn w/ PASP 47 mmHg  . Sjoegren syndrome   . Tricuspid regurgitation    a. 05/2015 Echo: mild to mod TR in setting of mild to mod PAH.    Current Outpatient Medications  Medication Sig Dispense Refill  . albuterol (PROVENTIL HFA;VENTOLIN HFA) 108 (90 Base) MCG/ACT inhaler Inhale 2 puffs into the lungs every 6 (six)  hours.    . brimonidine (ALPHAGAN P) 0.1 % SOLN Apply 2 drops to eye 2 (two) times daily.     . budesonide-formoterol (SYMBICORT) 160-4.5 MCG/ACT inhaler INHALE 2 PUFFS INTO THE LUNGS TWICE DAILY 10.2 g 0  . ELIQUIS 2.5 MG TABS tablet TAKE 1 TABLET(2.5 MG) BY MOUTH TWICE DAILY 180 tablet 0  . lidocaine (LIDODERM) 5 % PLACE ONE PATCH TOPICALLY TO THE PAINFUL AREA ONCE D FOR FIVE DAYS PRN P. REMOVE AND DISCARD PATCH WITHIN 12 HOURS.  0  . metoprolol tartrate (LOPRESSOR) 50 MG tablet Take 1 tablet (50 mg total) by mouth 2 (two) times daily. 180 tablet 3  . nystatin (MYCOSTATIN) 100000 UNIT/ML suspension Take 5 mLs (500,000 Units total) by mouth 4 (four) times daily. 200 mL 0  . omeprazole (PRILOSEC) 20 MG capsule TAKE 1 CAPSULE(20 MG) BY MOUTH TWICE DAILY BEFORE A MEAL 180 capsule 0  . PARoxetine (PAXIL) 10 MG tablet TAKE 1 TABLET(10 MG) BY MOUTH DAILY 90 tablet 0  . pilocarpine (SALAGEN) 5 MG tablet TAKE 1 TABLET(5 MG) BY MOUTH TWICE DAILY 180 tablet 0  . potassium chloride (K-DUR) 10 MEQ tablet Take 2 tablets (20 mEq total) by mouth daily. 60 tablet 2  . Probiotic Product (PROBIOTIC DAILY PO) Take 1 tablet by mouth daily.    . traMADol (ULTRAM) 50 MG tablet TAKE 1 TABLET BY MOUTH EVERY 8  HOURS AS NEEDED 90 tablet 0   No current facility-administered medications for this visit.     Allergies  Allergen Reactions  . Penicillins Anaphylaxis and Hives    States "almost died" Has patient had a PCN reaction causing immediate rash, facial/tongue/throat swelling, SOB or lightheadedness with hypotension: Yes Has patient had a PCN reaction causing severe rash involving mucus membranes or skin necrosis: No Has patient had a PCN reaction that required hospitalization No Has patient had a PCN reaction occurring within the last 10 years: No If all of the above answers are "NO", then may proceed with Cephalosporin use.  . Codeine Hives    Family History  Problem Relation Age of Onset  . Heart disease  Mother   . Stroke Mother   . Mental illness Mother   . Emphysema Mother   . Lung cancer Father   . Arthritis Sister   . Alcohol abuse Paternal Aunt   . Alcohol abuse Paternal Uncle   . Diabetes Neg Hx     Social History   Socioeconomic History  . Marital status: Divorced    Spouse name: Not on file  . Number of children: Not on file  . Years of education: Not on file  . Highest education level: Not on file  Occupational History  . Not on file  Social Needs  . Financial resource strain: Not on file  . Food insecurity:    Worry: Not on file    Inability: Not on file  . Transportation needs:    Medical: Not on file    Non-medical: Not on file  Tobacco Use  . Smoking status: Never Smoker  . Smokeless tobacco: Never Used  Substance and Sexual Activity  . Alcohol use: No    Alcohol/week: 0.0 standard drinks  . Drug use: No  . Sexual activity: Not on file  Lifestyle  . Physical activity:    Days per week: Not on file    Minutes per session: Not on file  . Stress: Not on file  Relationships  . Social connections:    Talks on phone: Not on file    Gets together: Not on file    Attends religious service: Not on file    Active member of club or organization: Not on file    Attends meetings of clubs or organizations: Not on file    Relationship status: Not on file  . Intimate partner violence:    Fear of current or ex partner: Not on file    Emotionally abused: Not on file    Physically abused: Not on file    Forced sexual activity: Not on file  Other Topics Concern  . Not on file  Social History Narrative  . Not on file    Hospitiliaztions: ER 10/05/17, fall  Health Maintenance:    Flu: 01/2016  Tetanus: declines  Pneumovax: declines  Prevnar: 10/2015  Zostavax: declines  Shingrix: declines  Mammogram: declines  Pap Smear: declines  Bone Density: declines  Colon Screening: declines  Eye Doctor: 10/2015, [facility]  Dental Exam: as  needed   Providers:   PCP: Webb Silversmith, NP-C  Cardiologist: Dr. Yvone Neu  Rheumatology: Dr. Jefm Bryant  Orthopedist:   I have personally reviewed and have noted:  1. The patient's medical and social history 2. Their use of alcohol, tobacco or illicit drugs 3. Their current medications and supplements 4. The patient's functional ability including ADL's, fall risks, home safety risks and hearing or visual impairment. 5. Diet  and physical activities 6. Evidence for depression or mood disorder  Subjective:   Review of Systems:   Constitutional: Pt reports weight loss. Denies fever, malaise, fatigue, headache.  HEENT: Pt reports difficulty hearing. Denies eye pain, eye redness, ear pain, ringing in the ears, wax buildup, runny nose, nasal congestion, bloody nose, or sore throat. Respiratory: Denies difficulty breathing, shortness of breath, cough or sputum production.   Cardiovascular: Denies chest pain, chest tightness, palpitations or swelling in the hands or feet.  Gastrointestinal: Denies abdominal pain, bloating, constipation, diarrhea or blood in the stool.  GU: Denies urgency, frequency, pain with urination, burning sensation, blood in urine, odor or discharge. Musculoskeletal: Pt reports joint pain, difficulty with gait. Denies decrease in range of motion, muscle pain or jointswelling.  Skin: Pt reports non healing wound to right side of face, with drainage, foul odor. Denies redness, rashes, lesions or ulcercations.  Neurological: Denies dizziness, difficulty with memory, difficulty with speech or problems with balance and coordination.  Psych: Pt reports a history of depression. Denies anxiety, SI/HI.  No other specific complaints in a complete review of systems (except as listed in HPI above).  Objective:  PE:   BP 104/66   Pulse 88   Temp 97.6 F (36.4 C) (Oral)   Ht 4\' 11"  (1.499 m)   Wt 96 lb (43.5 kg)   BMI 19.39 kg/m   Wt Readings from Last 3 Encounters:   11/06/17 98 lb (44.5 kg)  04/01/17 112 lb 8 oz (51 kg)  11/20/16 102 lb 8 oz (46.5 kg)    General: Appears herstated age, chronically ill appearing, in NAD. Skin: 1 cm open area to right side of face, green pus noted with foul odor. HEENT: Head: normal shape and size; Eyes: sclera white, no icterus, conjunctiva pink, PERRLA and EOMs intact; Ears: Tm's gray and intact, normal light reflex; Throat/Mouth: Dentures present, mucosa pink and moist, no exudate, lesions or ulcerations noted.  Neck: Neck supple, trachea midline. No masses, lumps or thyromegaly present.  Cardiovascular: Normal rate with irregular rhythm.  No murmur, rubs or gallops noted. No JVD or BLE edema. No carotid bruits noted. Pulmonary/Chest: Normal effort and positive vesicular breath sounds. No respiratory distress. No wheezes, rales or ronchi noted.  Abdomen: Soft and nontender. Normal bowel sounds. No distention or masses noted. Liver, spleen and kidneys non palpable. Musculoskeletal: Strength 5/5 RUE, 4/5 LUE. 5/5 BLE. Gait slow and steady with use of walker. Neurological: Alert and oriented. HOH. Cranial nerves II-XII grossly intact.  Psychiatric: Mood and affect normal. Behavior is normal. Judgment and thought content normal.     BMET    Component Value Date/Time   NA 140 10/24/2016 0915   NA 139 10/20/2016 1425   NA 142 06/06/2012 1322   K 4.6 10/24/2016 0915   K 3.5 06/06/2012 1322   CL 112 (H) 10/24/2016 0915   CL 114 (H) 06/06/2012 1322   CO2 23 10/24/2016 0915   CO2 21 06/06/2012 1322   GLUCOSE 64 (L) 10/24/2016 0915   GLUCOSE 95 06/06/2012 1322   BUN 34 (H) 10/24/2016 0915   BUN 34 (H) 10/20/2016 1425   BUN 14 06/06/2012 1322   CREATININE 1.10 (H) 10/24/2016 0915   CREATININE 0.97 06/06/2012 1322   CALCIUM 10.4 (H) 10/24/2016 0915   CALCIUM 9.8 06/06/2012 1322   GFRNONAA 44 (L) 10/24/2016 0915   GFRNONAA 55 (L) 06/06/2012 1322   GFRAA 52 (L) 10/24/2016 0915   GFRAA >60 06/06/2012 1322  Lipid Panel     Component Value Date/Time   CHOL 142 10/18/2015 0858   TRIG 76.0 10/18/2015 0858   HDL 46.30 10/18/2015 0858   CHOLHDL 3 10/18/2015 0858   VLDL 15.2 10/18/2015 0858   LDLCALC 80 10/18/2015 0858    CBC    Component Value Date/Time   WBC 4.1 09/17/2016 1608   RBC 4.23 09/17/2016 1608   HGB 13.3 09/17/2016 1608   HGB 10.9 (L) 06/06/2012 1322   HCT 40.3 09/17/2016 1608   HCT 33.4 (L) 06/06/2012 1322   PLT 169 09/17/2016 1608   PLT 404 06/06/2012 1322   MCV 95.2 09/17/2016 1608   MCV 82 06/06/2012 1322   MCH 31.4 09/17/2016 1608   MCHC 32.9 09/17/2016 1608   RDW 13.7 09/17/2016 1608   RDW 15.8 (H) 06/06/2012 1322   LYMPHSABS 1.1 09/17/2016 1608   MONOABS 0.3 09/17/2016 1608   EOSABS 0.0 09/17/2016 1608   BASOSABS 0.1 09/17/2016 1608    Hgb A1C No results found for: HGBA1C    Assessment and Plan:   Medicare Annual Wellness Visit:  Diet: She does not eat meat. She consumes fruits and veggies daily. She does not eat fried foods. She drinks mostly water and Boost. Physical activity: Sedentary Depression/mood screen: Chronic but stable on meds Hearing: Intact to whispered voice Visual acuity: Grossly normal, performs annual eye exam  ADLs: Capable Fall risk: Moderate fall risk Home safety: Good Cognitive evaluation: Intact to orientation, naming, recall and repetition EOL planning: Adv directives, full code/ I agree  Preventative Medicine: Flu shot today. She declines pneumovax, tetanus, zostavax, shingrix. prevnar UTD. Mammogram, pap smear, colon screening not indicated. She declines bone density exam. Encouraged her to consume a balanced diet and exercise regimen. Advised her to see an eye doctor and dentist annually. Will check CBC, CMET, Lipid and Vit D today.  Infected Wound of Face:  Clean with warm water and soap BID eRx for Doxycycline 100 mg BID x 10 days May need to follow up with dermatology  Malnutrition:  Will trial Mirtazapine  7.5 mg daily  Next appointment: 1 year, Medicare Wellness Exam   Webb Silversmith, NP

## 2017-11-30 NOTE — Telephone Encounter (Signed)
[facility] called with critical results. Potassium 2.8

## 2017-11-30 NOTE — Telephone Encounter (Signed)
Peggy Carter is aware ok per [facility]

## 2017-11-30 NOTE — Assessment & Plan Note (Signed)
Stable on current dose of Tramadol Will monitor

## 2017-11-30 NOTE — Assessment & Plan Note (Signed)
Stable on Omeprazole CBC and CMET today Will monitor 

## 2017-11-30 NOTE — Assessment & Plan Note (Signed)
Support offered today Continue Paroxetine Will monitor

## 2017-11-30 NOTE — Assessment & Plan Note (Signed)
Controlled on Metoprolol, Digoxin and Eliquis She will continue to follow with cardiology

## 2017-11-30 NOTE — Assessment & Plan Note (Signed)
Stable on Symbicort and prn Albuterol She is not interested in referral to pulmonology at this time Will monitor

## 2017-11-30 NOTE — Patient Instructions (Signed)
Health Maintenance for Postmenopausal Women Menopause is a normal process in which your reproductive ability comes to an end. This process happens gradually over a span of months to years, usually between the ages of 22 and 9. Menopause is complete when you have missed 12 consecutive menstrual periods. It is important to talk with your health care provider about some of the most common conditions that affect postmenopausal women, such as heart disease, cancer, and bone loss (osteoporosis). Adopting a healthy lifestyle and getting preventive care can help to promote your health and wellness. Those actions can also lower your chances of developing some of these common conditions. What should I know about menopause? During menopause, you may experience a number of symptoms, such as:  Moderate-to-severe hot flashes.  Night sweats.  Decrease in sex drive.  Mood swings.  Headaches.  Tiredness.  Irritability.  Memory problems.  Insomnia.  Choosing to treat or not to treat menopausal changes is an individual decision that you make with your health care provider. What should I know about hormone replacement therapy and supplements? Hormone therapy products are effective for treating symptoms that are associated with menopause, such as hot flashes and night sweats. Hormone replacement carries certain risks, especially as you become older. If you are thinking about using estrogen or estrogen with progestin treatments, discuss the benefits and risks with your health care provider. What should I know about heart disease and stroke? Heart disease, heart attack, and stroke become more likely as you age. This may be due, in part, to the hormonal changes that your body experiences during menopause. These can affect how your body processes dietary fats, triglycerides, and cholesterol. Heart attack and stroke are both medical emergencies. There are many things that you can do to help prevent heart disease  and stroke:  Have your blood pressure checked at least every 1-2 years. High blood pressure causes heart disease and increases the risk of stroke.  If you are 53-22 years old, ask your health care provider if you should take aspirin to prevent a heart attack or a stroke.  Do not use any tobacco products, including cigarettes, chewing tobacco, or electronic cigarettes. If you need help quitting, ask your health care provider.  It is important to eat a healthy diet and maintain a healthy weight. ? Be sure to include plenty of vegetables, fruits, low-fat dairy products, and lean protein. ? Avoid eating foods that are high in solid fats, added sugars, or salt (sodium).  Get regular exercise. This is one of the most important things that you can do for your health. ? Try to exercise for at least 150 minutes each week. The type of exercise that you do should increase your heart rate and make you sweat. This is known as moderate-intensity exercise. ? Try to do strengthening exercises at least twice each week. Do these in addition to the moderate-intensity exercise.  Know your numbers.Ask your health care provider to check your cholesterol and your blood glucose. Continue to have your blood tested as directed by your health care provider.  What should I know about cancer screening? There are several types of cancer. Take the following steps to reduce your risk and to catch any cancer development as early as possible. Breast Cancer  Practice breast self-awareness. ? This means understanding how your breasts normally appear and feel. ? It also means doing regular breast self-exams. Let your health care provider know about any changes, no matter how small.  If you are 40  or older, have a clinician do a breast exam (clinical breast exam or CBE) every year. Depending on your age, family history, and medical history, it may be recommended that you also have a yearly breast X-ray (mammogram).  If you  have a family history of breast cancer, talk with your health care provider about genetic screening.  If you are at high risk for breast cancer, talk with your health care provider about having an MRI and a mammogram every year.  Breast cancer (BRCA) gene test is recommended for women who have family members with BRCA-related cancers. Results of the assessment will determine the need for genetic counseling and BRCA1 and for BRCA2 testing. BRCA-related cancers include these types: ? Breast. This occurs in males or females. ? Ovarian. ? Tubal. This may also be called fallopian tube cancer. ? Cancer of the abdominal or pelvic lining (peritoneal cancer). ? Prostate. ? Pancreatic.  Cervical, Uterine, and Ovarian Cancer Your health care provider may recommend that you be screened regularly for cancer of the pelvic organs. These include your ovaries, uterus, and vagina. This screening involves a pelvic exam, which includes checking for microscopic changes to the surface of your cervix (Pap test).  For women ages 21-65, health care providers may recommend a pelvic exam and a Pap test every three years. For women ages 79-65, they may recommend the Pap test and pelvic exam, combined with testing for human papilloma virus (HPV), every five years. Some types of HPV increase your risk of cervical cancer. Testing for HPV may also be done on women of any age who have unclear Pap test results.  Other health care providers may not recommend any screening for nonpregnant women who are considered low risk for pelvic cancer and have no symptoms. Ask your health care provider if a screening pelvic exam is right for you.  If you have had past treatment for cervical cancer or a condition that could lead to cancer, you need Pap tests and screening for cancer for at least 20 years after your treatment. If Pap tests have been discontinued for you, your risk factors (such as having a new sexual partner) need to be  reassessed to determine if you should start having screenings again. Some women have medical problems that increase the chance of getting cervical cancer. In these cases, your health care provider may recommend that you have screening and Pap tests more often.  If you have a family history of uterine cancer or ovarian cancer, talk with your health care provider about genetic screening.  If you have vaginal bleeding after reaching menopause, tell your health care provider.  There are currently no reliable tests available to screen for ovarian cancer.  Lung Cancer Lung cancer screening is recommended for adults 69-62 years old who are at high risk for lung cancer because of a history of smoking. A yearly low-dose CT scan of the lungs is recommended if you:  Currently smoke.  Have a history of at least 30 pack-years of smoking and you currently smoke or have quit within the past 15 years. A pack-year is smoking an average of one pack of cigarettes per day for one year.  Yearly screening should:  Continue until it has been 15 years since you quit.  Stop if you develop a health problem that would prevent you from having lung cancer treatment.  Colorectal Cancer  This type of cancer can be detected and can often be prevented.  Routine colorectal cancer screening usually begins at  age 42 and continues through age 45.  If you have risk factors for colon cancer, your health care provider may recommend that you be screened at an earlier age.  If you have a family history of colorectal cancer, talk with your health care provider about genetic screening.  Your health care provider may also recommend using home test kits to check for hidden blood in your stool.  A small camera at the end of a tube can be used to examine your colon directly (sigmoidoscopy or colonoscopy). This is done to check for the earliest forms of colorectal cancer.  Direct examination of the colon should be repeated every  5-10 years until age 71. However, if early forms of precancerous polyps or small growths are found or if you have a family history or genetic risk for colorectal cancer, you may need to be screened more often.  Skin Cancer  Check your skin from head to toe regularly.  Monitor any moles. Be sure to tell your health care provider: ? About any new moles or changes in moles, especially if there is a change in a mole's shape or color. ? If you have a mole that is larger than the size of a pencil eraser.  If any of your family members has a history of skin cancer, especially at a young age, talk with your health care provider about genetic screening.  Always use sunscreen. Apply sunscreen liberally and repeatedly throughout the day.  Whenever you are outside, protect yourself by wearing long sleeves, pants, a wide-brimmed hat, and sunglasses.  What should I know about osteoporosis? Osteoporosis is a condition in which bone destruction happens more quickly than new bone creation. After menopause, you may be at an increased risk for osteoporosis. To help prevent osteoporosis or the bone fractures that can happen because of osteoporosis, the following is recommended:  If you are 46-71 years old, get at least 1,000 mg of calcium and at least 600 mg of vitamin D per day.  If you are older than age 55 but younger than age 65, get at least 1,200 mg of calcium and at least 600 mg of vitamin D per day.  If you are older than age 54, get at least 1,200 mg of calcium and at least 800 mg of vitamin D per day.  Smoking and excessive alcohol intake increase the risk of osteoporosis. Eat foods that are rich in calcium and vitamin D, and do weight-bearing exercises several times each week as directed by your health care provider. What should I know about how menopause affects my mental health? Depression may occur at any age, but it is more common as you become older. Common symptoms of depression  include:  Low or sad mood.  Changes in sleep patterns.  Changes in appetite or eating patterns.  Feeling an overall lack of motivation or enjoyment of activities that you previously enjoyed.  Frequent crying spells.  Talk with your health care provider if you think that you are experiencing depression. What should I know about immunizations? It is important that you get and maintain your immunizations. These include:  Tetanus, diphtheria, and pertussis (Tdap) booster vaccine.  Influenza every year before the flu season begins.  Pneumonia vaccine.  Shingles vaccine.  Your health care provider may also recommend other immunizations. This information is not intended to replace advice given to you by your health care provider. Make sure you discuss any questions you have with your health care provider. Document Released: 04/11/2005  Document Revised: 09/07/2015 Document Reviewed: 11/21/2014 Elsevier Interactive Patient Education  2018 [employer]  

## 2017-11-30 NOTE — Assessment & Plan Note (Signed)
Stable on Pilocarpine She will continue to follow with rheumatology

## 2017-11-30 NOTE — Telephone Encounter (Signed)
Call pt:  Potassium low. She needs to let cardiology know since they stopped her potassium and see if they want her to restart.

## 2017-11-30 NOTE — Assessment & Plan Note (Signed)
Controlled on Metoprolol and Digoxin CBC and CMET today Reinforced DASH diet

## 2017-12-08 ENCOUNTER — Other Ambulatory Visit: Payer: Self-pay | Admitting: Internal Medicine

## 2017-12-09 NOTE — Telephone Encounter (Signed)
Tramadol last filled 11/05/2017... Please advise

## 2017-12-29 ENCOUNTER — Encounter: Payer: Self-pay | Admitting: Internal Medicine

## 2018-01-02 ENCOUNTER — Other Ambulatory Visit: Payer: Self-pay | Admitting: Internal Medicine

## 2018-01-02 MED ORDER — SULFAMETHOXAZOLE-TRIMETHOPRIM 400-80 MG PO TABS: 1.0000 | ORAL_TABLET | Freq: Two times a day (BID) | ORAL | 0 refills | Status: DC

## 2018-01-08 ENCOUNTER — Telehealth: Payer: Self-pay

## 2018-01-08 ENCOUNTER — Other Ambulatory Visit: Payer: Self-pay | Admitting: Internal Medicine

## 2018-01-08 NOTE — Telephone Encounter (Signed)
Pt scheduled my chart appt on 01/15/18 for SOB; I spoke with Nanette(DPR signed) inhaler is not helping as much as it used to. Pt does not feel comfortable using rescue inhaler due to making her heart beat faster.I offered pt an appt this afternoon with R Baity NP but Nanette talked with pt this morning and she was doing OK; Michela Pitcher is now out of town and wants to keep appt on 01/15/18. If pt condition changes or worsens prior to appt  Nanette will cb or pt will go to [facility] or ED if needed.FYI to Avie Echevaria NP.

## 2018-01-08 NOTE — Telephone Encounter (Signed)
Tramadol last filled 12/09/2017.... Please advise

## 2018-01-08 NOTE — Telephone Encounter (Signed)
noted 

## 2018-01-15 ENCOUNTER — Ambulatory Visit (INDEPENDENT_AMBULATORY_CARE_PROVIDER_SITE_OTHER): Payer: Medicare Other | Admitting: Family Medicine

## 2018-01-15 ENCOUNTER — Encounter: Payer: Self-pay | Admitting: Family Medicine

## 2018-01-15 DIAGNOSIS — J4520 Mild intermittent asthma, uncomplicated: Secondary | ICD-10-CM

## 2018-01-15 NOTE — Patient Instructions (Addendum)
Get a spacer and start using that with the inhalers.   That should help.   Update [place] Monday Take care.  Glad to see you.  I'll update Baity in the meantime.

## 2018-01-15 NOTE — Progress Notes (Signed)
The following information is correct to the best of my ability.  The patient is hard of hearing and I repeatedly spoke at a loud enough volume for her to understand me.  More importantly is the fact that she required redirection throughout the conversation to try to establish a reliable timeline of recent events in her history.  She dry heaved about 2 weeks ago. EMS called but didn't go to ER.  Was told she likely had a panic attack at the time.  This appears to be an isolated event.  Has been episodically SOB in the meantime. She has had trouble pressing her inhalers to trigger med delivery.  Family has been helping her with her inhalers.  She is not been using her nebulizer or spacer.  No known fevers.  No sputum.  Some post nasal gtt.  No other sputum.  Some sputum.  She is already on Eliquis.     She had another episode a few days later with dec in sensation in the BLE w/o fall.  She was still able to walk to the sofa but according to the patient "I could not feel the floor".  She has not had symptoms like that otherwise in the meantime.  No new neuro sx in the meantime or now.  No focal weakness other than the changes that are clearly related to her previous left humerus fracture.  She does not have any history of or current unilateral symptoms otherwise.  She has a sore area on the right side of her face that she wanted evaluated.  It stays irritated.    Meds, vitals, and allergies reviewed.   ROS: Per HPI unless specifically indicated in ROS section   GEN: nad, alert and oriented, elderly female in wheelchair. HEENT: mucous membranes moist NECK: supple w/o LA CV: IRR PULM: ctab, no inc wob, no wheeze. ABD: soft, +bs EXT: no edema SKIN: Mildly irritated lesion on R cheek.  Looks like a SK.  CN 2-12 wnl B except for hard of hearing, S/S/DTR wnl x4, with the exception of pain on range of motion of the left arm related to humerus fracture as expected.

## 2018-01-17 NOTE — Assessment & Plan Note (Signed)
There may be multiple overlapping issues here.  She is having trouble using her inhalers.  Her family is already trying to help out.  It would likely make sense to use a spacer.  That may be all she needs to more effectively use her inhalers.  I wrote an order slip for spacer and gave to her son with routine instructions.  If that does not help we may need to set her up with a nebulizer.  I will defer to PCP about that.  This may address her episodic shortness of breath.  It may be that she had a panic attack related to shortness of breath.  That would be understandable.  It also may be that she hyperventilated which caused transient symptoms that she described a few days ago.  She has no focal neurologic changes at this point and she is clearly out of the window for CVA treatment.  She is already anticoagulated.  I think it makes sense not to change her medication at this point and have her use a spacer as described above and then update [place] as needed.  Plan discussed in detail.  At this point she appears to be okay for outpatient follow-up with family support.  >25 minutes spent in face to face time with patient, >50% spent in counselling or coordination of care.

## 2018-01-19 ENCOUNTER — Other Ambulatory Visit: Payer: Self-pay | Admitting: Internal Medicine

## 2018-01-22 ENCOUNTER — Ambulatory Visit: Payer: Medicare Other | Admitting: Internal Medicine

## 2018-01-30 ENCOUNTER — Other Ambulatory Visit: Payer: Self-pay | Admitting: Internal Medicine

## 2018-02-07 ENCOUNTER — Other Ambulatory Visit: Payer: Self-pay | Admitting: Internal Medicine

## 2018-02-08 NOTE — Telephone Encounter (Signed)
Last filled 01/08/2018.... Please advise

## 2018-03-09 DIAGNOSIS — L89611 Pressure ulcer of right heel, stage 1: Secondary | ICD-10-CM | POA: Diagnosis not present

## 2018-03-09 DIAGNOSIS — L989 Disorder of the skin and subcutaneous tissue, unspecified: Secondary | ICD-10-CM | POA: Diagnosis not present

## 2018-03-09 DIAGNOSIS — L89891 Pressure ulcer of other site, stage 1: Secondary | ICD-10-CM | POA: Diagnosis not present

## 2018-03-12 DIAGNOSIS — B351 Tinea unguium: Secondary | ICD-10-CM | POA: Diagnosis not present

## 2018-03-12 DIAGNOSIS — S91311D Laceration without foreign body, right foot, subsequent encounter: Secondary | ICD-10-CM | POA: Diagnosis not present

## 2018-03-12 DIAGNOSIS — Z7722 Contact with and (suspected) exposure to environmental tobacco smoke (acute) (chronic): Secondary | ICD-10-CM | POA: Diagnosis not present

## 2018-03-12 DIAGNOSIS — I480 Paroxysmal atrial fibrillation: Secondary | ICD-10-CM | POA: Diagnosis not present

## 2018-03-12 DIAGNOSIS — I96 Gangrene, not elsewhere classified: Secondary | ICD-10-CM | POA: Diagnosis not present

## 2018-03-12 DIAGNOSIS — R0602 Shortness of breath: Secondary | ICD-10-CM | POA: Diagnosis not present

## 2018-03-12 DIAGNOSIS — A419 Sepsis, unspecified organism: Secondary | ICD-10-CM | POA: Diagnosis not present

## 2018-03-12 DIAGNOSIS — R790 Abnormal level of blood mineral: Secondary | ICD-10-CM | POA: Diagnosis not present

## 2018-03-12 DIAGNOSIS — I50810 Right heart failure, unspecified: Secondary | ICD-10-CM | POA: Diagnosis present

## 2018-03-12 DIAGNOSIS — R2232 Localized swelling, mass and lump, left upper limb: Secondary | ICD-10-CM | POA: Diagnosis not present

## 2018-03-12 DIAGNOSIS — M869 Osteomyelitis, unspecified: Secondary | ICD-10-CM | POA: Diagnosis not present

## 2018-03-12 DIAGNOSIS — G9340 Encephalopathy, unspecified: Secondary | ICD-10-CM | POA: Diagnosis not present

## 2018-03-12 DIAGNOSIS — I482 Chronic atrial fibrillation, unspecified: Secondary | ICD-10-CM | POA: Diagnosis not present

## 2018-03-12 DIAGNOSIS — J9600 Acute respiratory failure, unspecified whether with hypoxia or hypercapnia: Secondary | ICD-10-CM | POA: Diagnosis not present

## 2018-03-12 DIAGNOSIS — M79602 Pain in left arm: Secondary | ICD-10-CM | POA: Diagnosis not present

## 2018-03-12 DIAGNOSIS — L819 Disorder of pigmentation, unspecified: Secondary | ICD-10-CM | POA: Diagnosis not present

## 2018-03-12 DIAGNOSIS — R0600 Dyspnea, unspecified: Secondary | ICD-10-CM | POA: Diagnosis not present

## 2018-03-12 DIAGNOSIS — I70293 Other atherosclerosis of native arteries of extremities, bilateral legs: Secondary | ICD-10-CM | POA: Diagnosis not present

## 2018-03-12 DIAGNOSIS — R609 Edema, unspecified: Secondary | ICD-10-CM | POA: Diagnosis not present

## 2018-03-12 DIAGNOSIS — L89610 Pressure ulcer of right heel, unstageable: Secondary | ICD-10-CM | POA: Diagnosis not present

## 2018-03-12 DIAGNOSIS — E872 Acidosis: Secondary | ICD-10-CM | POA: Diagnosis not present

## 2018-03-12 DIAGNOSIS — M7989 Other specified soft tissue disorders: Secondary | ICD-10-CM | POA: Diagnosis not present

## 2018-03-12 DIAGNOSIS — L03115 Cellulitis of right lower limb: Secondary | ICD-10-CM | POA: Diagnosis not present

## 2018-03-12 DIAGNOSIS — L89623 Pressure ulcer of left heel, stage 3: Secondary | ICD-10-CM | POA: Diagnosis not present

## 2018-03-12 DIAGNOSIS — E876 Hypokalemia: Secondary | ICD-10-CM | POA: Diagnosis not present

## 2018-03-12 DIAGNOSIS — J4520 Mild intermittent asthma, uncomplicated: Secondary | ICD-10-CM | POA: Diagnosis present

## 2018-03-12 DIAGNOSIS — J9690 Respiratory failure, unspecified, unspecified whether with hypoxia or hypercapnia: Secondary | ICD-10-CM | POA: Diagnosis not present

## 2018-03-12 DIAGNOSIS — M3500 Sicca syndrome, unspecified: Secondary | ICD-10-CM | POA: Diagnosis present

## 2018-03-12 DIAGNOSIS — I739 Peripheral vascular disease, unspecified: Secondary | ICD-10-CM | POA: Diagnosis not present

## 2018-03-12 DIAGNOSIS — E875 Hyperkalemia: Secondary | ICD-10-CM | POA: Diagnosis not present

## 2018-03-12 DIAGNOSIS — M79601 Pain in right arm: Secondary | ICD-10-CM | POA: Diagnosis not present

## 2018-03-12 DIAGNOSIS — Z7901 Long term (current) use of anticoagulants: Secondary | ICD-10-CM | POA: Diagnosis not present

## 2018-03-12 DIAGNOSIS — H409 Unspecified glaucoma: Secondary | ICD-10-CM | POA: Diagnosis present

## 2018-03-12 DIAGNOSIS — A409 Streptococcal sepsis, unspecified: Secondary | ICD-10-CM | POA: Diagnosis not present

## 2018-03-12 DIAGNOSIS — N179 Acute kidney failure, unspecified: Secondary | ICD-10-CM | POA: Diagnosis not present

## 2018-03-12 DIAGNOSIS — E873 Alkalosis: Secondary | ICD-10-CM | POA: Diagnosis not present

## 2018-03-12 DIAGNOSIS — L97409 Non-pressure chronic ulcer of unspecified heel and midfoot with unspecified severity: Secondary | ICD-10-CM | POA: Diagnosis not present

## 2018-03-12 DIAGNOSIS — I509 Heart failure, unspecified: Secondary | ICD-10-CM | POA: Diagnosis not present

## 2018-03-12 DIAGNOSIS — R7881 Bacteremia: Secondary | ICD-10-CM | POA: Diagnosis not present

## 2018-03-12 DIAGNOSIS — I081 Rheumatic disorders of both mitral and tricuspid valves: Secondary | ICD-10-CM | POA: Diagnosis not present

## 2018-03-12 DIAGNOSIS — J449 Chronic obstructive pulmonary disease, unspecified: Secondary | ICD-10-CM | POA: Diagnosis present

## 2018-03-12 DIAGNOSIS — R7989 Other specified abnormal findings of blood chemistry: Secondary | ICD-10-CM | POA: Diagnosis not present

## 2018-03-12 DIAGNOSIS — I4891 Unspecified atrial fibrillation: Secondary | ICD-10-CM | POA: Diagnosis not present

## 2018-03-12 DIAGNOSIS — J9601 Acute respiratory failure with hypoxia: Secondary | ICD-10-CM | POA: Diagnosis not present

## 2018-03-12 DIAGNOSIS — L97519 Non-pressure chronic ulcer of other part of right foot with unspecified severity: Secondary | ICD-10-CM | POA: Diagnosis not present

## 2018-03-12 DIAGNOSIS — I4892 Unspecified atrial flutter: Secondary | ICD-10-CM | POA: Diagnosis not present

## 2018-03-12 DIAGNOSIS — B951 Streptococcus, group B, as the cause of diseases classified elsewhere: Secondary | ICD-10-CM | POA: Diagnosis not present

## 2018-03-14 ENCOUNTER — Other Ambulatory Visit: Payer: Self-pay | Admitting: Internal Medicine

## 2018-03-15 NOTE — Telephone Encounter (Signed)
Last filled 02/08/2018... please advise

## 2018-03-31 DIAGNOSIS — J9601 Acute respiratory failure with hypoxia: Secondary | ICD-10-CM | POA: Diagnosis not present

## 2018-03-31 DIAGNOSIS — L89512 Pressure ulcer of right ankle, stage 2: Secondary | ICD-10-CM | POA: Diagnosis not present

## 2018-03-31 DIAGNOSIS — A401 Sepsis due to streptococcus, group B: Secondary | ICD-10-CM | POA: Diagnosis not present

## 2018-03-31 DIAGNOSIS — Z452 Encounter for adjustment and management of vascular access device: Secondary | ICD-10-CM | POA: Diagnosis not present

## 2018-03-31 DIAGNOSIS — I482 Chronic atrial fibrillation, unspecified: Secondary | ICD-10-CM | POA: Diagnosis not present

## 2018-03-31 DIAGNOSIS — L89522 Pressure ulcer of left ankle, stage 2: Secondary | ICD-10-CM | POA: Diagnosis not present

## 2018-03-31 DIAGNOSIS — I5082 Biventricular heart failure: Secondary | ICD-10-CM | POA: Diagnosis not present

## 2018-03-31 DIAGNOSIS — L89892 Pressure ulcer of other site, stage 2: Secondary | ICD-10-CM | POA: Diagnosis not present

## 2018-03-31 DIAGNOSIS — J449 Chronic obstructive pulmonary disease, unspecified: Secondary | ICD-10-CM | POA: Diagnosis not present

## 2018-03-31 DIAGNOSIS — Z7901 Long term (current) use of anticoagulants: Secondary | ICD-10-CM | POA: Diagnosis not present

## 2018-04-01 DIAGNOSIS — L89522 Pressure ulcer of left ankle, stage 2: Secondary | ICD-10-CM | POA: Diagnosis not present

## 2018-04-01 DIAGNOSIS — A401 Sepsis due to streptococcus, group B: Secondary | ICD-10-CM | POA: Diagnosis not present

## 2018-04-01 DIAGNOSIS — J9601 Acute respiratory failure with hypoxia: Secondary | ICD-10-CM | POA: Diagnosis not present

## 2018-04-01 DIAGNOSIS — L89512 Pressure ulcer of right ankle, stage 2: Secondary | ICD-10-CM | POA: Diagnosis not present

## 2018-04-01 DIAGNOSIS — Z452 Encounter for adjustment and management of vascular access device: Secondary | ICD-10-CM | POA: Diagnosis not present

## 2018-04-01 DIAGNOSIS — E875 Hyperkalemia: Secondary | ICD-10-CM | POA: Diagnosis not present

## 2018-04-01 DIAGNOSIS — L89892 Pressure ulcer of other site, stage 2: Secondary | ICD-10-CM | POA: Diagnosis not present

## 2018-04-01 DIAGNOSIS — I482 Chronic atrial fibrillation, unspecified: Secondary | ICD-10-CM | POA: Diagnosis not present

## 2018-04-01 DIAGNOSIS — R6520 Severe sepsis without septic shock: Secondary | ICD-10-CM | POA: Diagnosis not present

## 2018-04-05 DIAGNOSIS — A401 Sepsis due to streptococcus, group B: Secondary | ICD-10-CM | POA: Diagnosis not present

## 2018-04-05 DIAGNOSIS — L89892 Pressure ulcer of other site, stage 2: Secondary | ICD-10-CM | POA: Diagnosis not present

## 2018-04-05 DIAGNOSIS — L89512 Pressure ulcer of right ankle, stage 2: Secondary | ICD-10-CM | POA: Diagnosis not present

## 2018-04-05 DIAGNOSIS — I482 Chronic atrial fibrillation, unspecified: Secondary | ICD-10-CM | POA: Diagnosis not present

## 2018-04-05 DIAGNOSIS — L89522 Pressure ulcer of left ankle, stage 2: Secondary | ICD-10-CM | POA: Diagnosis not present

## 2018-04-05 DIAGNOSIS — M86179 Other acute osteomyelitis, unspecified ankle and foot: Secondary | ICD-10-CM | POA: Diagnosis not present

## 2018-04-05 DIAGNOSIS — Z452 Encounter for adjustment and management of vascular access device: Secondary | ICD-10-CM | POA: Diagnosis not present

## 2018-04-08 DIAGNOSIS — L89892 Pressure ulcer of other site, stage 2: Secondary | ICD-10-CM | POA: Diagnosis not present

## 2018-04-08 DIAGNOSIS — Z452 Encounter for adjustment and management of vascular access device: Secondary | ICD-10-CM | POA: Diagnosis not present

## 2018-04-08 DIAGNOSIS — L89512 Pressure ulcer of right ankle, stage 2: Secondary | ICD-10-CM | POA: Diagnosis not present

## 2018-04-08 DIAGNOSIS — I482 Chronic atrial fibrillation, unspecified: Secondary | ICD-10-CM | POA: Diagnosis not present

## 2018-04-08 DIAGNOSIS — L89522 Pressure ulcer of left ankle, stage 2: Secondary | ICD-10-CM | POA: Diagnosis not present

## 2018-04-08 DIAGNOSIS — A401 Sepsis due to streptococcus, group B: Secondary | ICD-10-CM | POA: Diagnosis not present

## 2018-04-09 DIAGNOSIS — L89892 Pressure ulcer of other site, stage 2: Secondary | ICD-10-CM | POA: Diagnosis not present

## 2018-04-09 DIAGNOSIS — L89512 Pressure ulcer of right ankle, stage 2: Secondary | ICD-10-CM | POA: Diagnosis not present

## 2018-04-09 DIAGNOSIS — Z452 Encounter for adjustment and management of vascular access device: Secondary | ICD-10-CM | POA: Diagnosis not present

## 2018-04-09 DIAGNOSIS — I482 Chronic atrial fibrillation, unspecified: Secondary | ICD-10-CM | POA: Diagnosis not present

## 2018-04-09 DIAGNOSIS — L89522 Pressure ulcer of left ankle, stage 2: Secondary | ICD-10-CM | POA: Diagnosis not present

## 2018-04-09 DIAGNOSIS — A401 Sepsis due to streptococcus, group B: Secondary | ICD-10-CM | POA: Diagnosis not present

## 2018-04-12 DIAGNOSIS — A401 Sepsis due to streptococcus, group B: Secondary | ICD-10-CM | POA: Diagnosis not present

## 2018-04-12 DIAGNOSIS — I482 Chronic atrial fibrillation, unspecified: Secondary | ICD-10-CM | POA: Diagnosis not present

## 2018-04-12 DIAGNOSIS — Z452 Encounter for adjustment and management of vascular access device: Secondary | ICD-10-CM | POA: Diagnosis not present

## 2018-04-12 DIAGNOSIS — M86179 Other acute osteomyelitis, unspecified ankle and foot: Secondary | ICD-10-CM | POA: Diagnosis not present

## 2018-04-12 DIAGNOSIS — L89512 Pressure ulcer of right ankle, stage 2: Secondary | ICD-10-CM | POA: Diagnosis not present

## 2018-04-12 DIAGNOSIS — L89892 Pressure ulcer of other site, stage 2: Secondary | ICD-10-CM | POA: Diagnosis not present

## 2018-04-12 DIAGNOSIS — L89522 Pressure ulcer of left ankle, stage 2: Secondary | ICD-10-CM | POA: Diagnosis not present

## 2018-04-15 DIAGNOSIS — A401 Sepsis due to streptococcus, group B: Secondary | ICD-10-CM | POA: Diagnosis not present

## 2018-04-15 DIAGNOSIS — L89512 Pressure ulcer of right ankle, stage 2: Secondary | ICD-10-CM | POA: Diagnosis not present

## 2018-04-15 DIAGNOSIS — I482 Chronic atrial fibrillation, unspecified: Secondary | ICD-10-CM | POA: Diagnosis not present

## 2018-04-15 DIAGNOSIS — Z452 Encounter for adjustment and management of vascular access device: Secondary | ICD-10-CM | POA: Diagnosis not present

## 2018-04-15 DIAGNOSIS — L89522 Pressure ulcer of left ankle, stage 2: Secondary | ICD-10-CM | POA: Diagnosis not present

## 2018-04-15 DIAGNOSIS — L89892 Pressure ulcer of other site, stage 2: Secondary | ICD-10-CM | POA: Diagnosis not present

## 2018-04-16 DIAGNOSIS — L89512 Pressure ulcer of right ankle, stage 2: Secondary | ICD-10-CM | POA: Diagnosis not present

## 2018-04-16 DIAGNOSIS — A401 Sepsis due to streptococcus, group B: Secondary | ICD-10-CM | POA: Diagnosis not present

## 2018-04-16 DIAGNOSIS — L89522 Pressure ulcer of left ankle, stage 2: Secondary | ICD-10-CM | POA: Diagnosis not present

## 2018-04-16 DIAGNOSIS — L89892 Pressure ulcer of other site, stage 2: Secondary | ICD-10-CM | POA: Diagnosis not present

## 2018-04-16 DIAGNOSIS — Z452 Encounter for adjustment and management of vascular access device: Secondary | ICD-10-CM | POA: Diagnosis not present

## 2018-04-16 DIAGNOSIS — I482 Chronic atrial fibrillation, unspecified: Secondary | ICD-10-CM | POA: Diagnosis not present

## 2018-04-17 ENCOUNTER — Other Ambulatory Visit: Payer: Self-pay | Admitting: Internal Medicine

## 2018-04-19 ENCOUNTER — Telehealth: Payer: Self-pay | Admitting: Internal Medicine

## 2018-04-19 DIAGNOSIS — Z452 Encounter for adjustment and management of vascular access device: Secondary | ICD-10-CM | POA: Diagnosis not present

## 2018-04-19 DIAGNOSIS — L89892 Pressure ulcer of other site, stage 2: Secondary | ICD-10-CM | POA: Diagnosis not present

## 2018-04-19 DIAGNOSIS — A401 Sepsis due to streptococcus, group B: Secondary | ICD-10-CM | POA: Diagnosis not present

## 2018-04-19 DIAGNOSIS — I482 Chronic atrial fibrillation, unspecified: Secondary | ICD-10-CM | POA: Diagnosis not present

## 2018-04-19 DIAGNOSIS — L89522 Pressure ulcer of left ankle, stage 2: Secondary | ICD-10-CM | POA: Diagnosis not present

## 2018-04-19 DIAGNOSIS — L89512 Pressure ulcer of right ankle, stage 2: Secondary | ICD-10-CM | POA: Diagnosis not present

## 2018-04-19 NOTE — Telephone Encounter (Signed)
-----   Message from Janan Ridge, [place] sent at 04/19/2018  8:26 AM EST ----- Regarding: appointment for refills Patient needs an appointment for further refills. If patient does not want to schedule an appointment please make them aware to contact PCP for refills. I have sent in enough medication until appointment can be made.   Thanks Ladies!

## 2018-04-19 NOTE — Telephone Encounter (Signed)
LMOV to schedule  

## 2018-04-19 NOTE — Telephone Encounter (Signed)
Patient needs an appointment for further refills.

## 2018-04-20 NOTE — Telephone Encounter (Signed)
lmov to schedule  °

## 2018-04-21 DIAGNOSIS — L89893 Pressure ulcer of other site, stage 3: Secondary | ICD-10-CM | POA: Diagnosis not present

## 2018-04-21 DIAGNOSIS — L89892 Pressure ulcer of other site, stage 2: Secondary | ICD-10-CM | POA: Diagnosis not present

## 2018-04-22 DIAGNOSIS — L89892 Pressure ulcer of other site, stage 2: Secondary | ICD-10-CM | POA: Diagnosis not present

## 2018-04-22 DIAGNOSIS — A401 Sepsis due to streptococcus, group B: Secondary | ICD-10-CM | POA: Diagnosis not present

## 2018-04-22 DIAGNOSIS — Z452 Encounter for adjustment and management of vascular access device: Secondary | ICD-10-CM | POA: Diagnosis not present

## 2018-04-22 DIAGNOSIS — L89512 Pressure ulcer of right ankle, stage 2: Secondary | ICD-10-CM | POA: Diagnosis not present

## 2018-04-22 DIAGNOSIS — I482 Chronic atrial fibrillation, unspecified: Secondary | ICD-10-CM | POA: Diagnosis not present

## 2018-04-22 DIAGNOSIS — L89522 Pressure ulcer of left ankle, stage 2: Secondary | ICD-10-CM | POA: Diagnosis not present

## 2018-04-23 NOTE — Telephone Encounter (Signed)
Spoke with Ronalee Belts from [facility].  Made him aware that we are not going to refill metoprolol at this time.  Patient has been in [place] and in and out of the hospital there.  Unsure if the dose has changed.   Made him aware that we will send in a refill when we have more information on the patient while in the hospital there.

## 2018-04-23 NOTE — Telephone Encounter (Signed)
Spoke with patient's son  Patient has been in [place] with her daughter Has been seen in the hospital while there Unable to make appointment at this time

## 2018-04-23 NOTE — Telephone Encounter (Signed)
Left a message on the patient's answering machine to contact our office regarding a follow up appointment that is needed for any medication refills. The patient was to follow up 3-4 weeks after her last office visit with Dr. Saunders Revel which was on 04/01/2017.

## 2018-04-26 DIAGNOSIS — L89512 Pressure ulcer of right ankle, stage 2: Secondary | ICD-10-CM | POA: Diagnosis not present

## 2018-04-26 DIAGNOSIS — A401 Sepsis due to streptococcus, group B: Secondary | ICD-10-CM | POA: Diagnosis not present

## 2018-04-26 DIAGNOSIS — I482 Chronic atrial fibrillation, unspecified: Secondary | ICD-10-CM | POA: Diagnosis not present

## 2018-04-26 DIAGNOSIS — Z452 Encounter for adjustment and management of vascular access device: Secondary | ICD-10-CM | POA: Diagnosis not present

## 2018-04-26 DIAGNOSIS — L89892 Pressure ulcer of other site, stage 2: Secondary | ICD-10-CM | POA: Diagnosis not present

## 2018-04-26 DIAGNOSIS — L89522 Pressure ulcer of left ankle, stage 2: Secondary | ICD-10-CM | POA: Diagnosis not present

## 2018-04-27 DIAGNOSIS — N952 Postmenopausal atrophic vaginitis: Secondary | ICD-10-CM | POA: Diagnosis not present

## 2018-04-27 DIAGNOSIS — M1990 Unspecified osteoarthritis, unspecified site: Secondary | ICD-10-CM | POA: Diagnosis not present

## 2018-04-29 DIAGNOSIS — L89512 Pressure ulcer of right ankle, stage 2: Secondary | ICD-10-CM | POA: Diagnosis not present

## 2018-04-29 DIAGNOSIS — L89892 Pressure ulcer of other site, stage 2: Secondary | ICD-10-CM | POA: Diagnosis not present

## 2018-04-29 DIAGNOSIS — A401 Sepsis due to streptococcus, group B: Secondary | ICD-10-CM | POA: Diagnosis not present

## 2018-04-29 DIAGNOSIS — Z452 Encounter for adjustment and management of vascular access device: Secondary | ICD-10-CM | POA: Diagnosis not present

## 2018-04-29 DIAGNOSIS — L89522 Pressure ulcer of left ankle, stage 2: Secondary | ICD-10-CM | POA: Diagnosis not present

## 2018-04-29 DIAGNOSIS — I482 Chronic atrial fibrillation, unspecified: Secondary | ICD-10-CM | POA: Diagnosis not present

## 2018-04-30 DIAGNOSIS — I482 Chronic atrial fibrillation, unspecified: Secondary | ICD-10-CM | POA: Diagnosis not present

## 2018-04-30 DIAGNOSIS — Z7901 Long term (current) use of anticoagulants: Secondary | ICD-10-CM | POA: Diagnosis not present

## 2018-04-30 DIAGNOSIS — I5082 Biventricular heart failure: Secondary | ICD-10-CM | POA: Diagnosis not present

## 2018-04-30 DIAGNOSIS — L89892 Pressure ulcer of other site, stage 2: Secondary | ICD-10-CM | POA: Diagnosis not present

## 2018-04-30 DIAGNOSIS — L89512 Pressure ulcer of right ankle, stage 2: Secondary | ICD-10-CM | POA: Diagnosis not present

## 2018-04-30 DIAGNOSIS — J9601 Acute respiratory failure with hypoxia: Secondary | ICD-10-CM | POA: Diagnosis not present

## 2018-04-30 DIAGNOSIS — J449 Chronic obstructive pulmonary disease, unspecified: Secondary | ICD-10-CM | POA: Diagnosis not present

## 2018-04-30 DIAGNOSIS — A401 Sepsis due to streptococcus, group B: Secondary | ICD-10-CM | POA: Diagnosis not present

## 2018-04-30 DIAGNOSIS — L89522 Pressure ulcer of left ankle, stage 2: Secondary | ICD-10-CM | POA: Diagnosis not present

## 2018-04-30 DIAGNOSIS — Z452 Encounter for adjustment and management of vascular access device: Secondary | ICD-10-CM | POA: Diagnosis not present

## 2018-05-03 DIAGNOSIS — L89892 Pressure ulcer of other site, stage 2: Secondary | ICD-10-CM | POA: Diagnosis not present

## 2018-05-03 DIAGNOSIS — I482 Chronic atrial fibrillation, unspecified: Secondary | ICD-10-CM | POA: Diagnosis not present

## 2018-05-03 DIAGNOSIS — L89512 Pressure ulcer of right ankle, stage 2: Secondary | ICD-10-CM | POA: Diagnosis not present

## 2018-05-03 DIAGNOSIS — A401 Sepsis due to streptococcus, group B: Secondary | ICD-10-CM | POA: Diagnosis not present

## 2018-05-03 DIAGNOSIS — L89522 Pressure ulcer of left ankle, stage 2: Secondary | ICD-10-CM | POA: Diagnosis not present

## 2018-05-03 DIAGNOSIS — Z452 Encounter for adjustment and management of vascular access device: Secondary | ICD-10-CM | POA: Diagnosis not present

## 2018-05-05 DIAGNOSIS — L89892 Pressure ulcer of other site, stage 2: Secondary | ICD-10-CM | POA: Diagnosis not present

## 2018-05-06 DIAGNOSIS — L89522 Pressure ulcer of left ankle, stage 2: Secondary | ICD-10-CM | POA: Diagnosis not present

## 2018-05-06 DIAGNOSIS — L89512 Pressure ulcer of right ankle, stage 2: Secondary | ICD-10-CM | POA: Diagnosis not present

## 2018-05-06 DIAGNOSIS — A401 Sepsis due to streptococcus, group B: Secondary | ICD-10-CM | POA: Diagnosis not present

## 2018-05-06 DIAGNOSIS — Z452 Encounter for adjustment and management of vascular access device: Secondary | ICD-10-CM | POA: Diagnosis not present

## 2018-05-06 DIAGNOSIS — L89892 Pressure ulcer of other site, stage 2: Secondary | ICD-10-CM | POA: Diagnosis not present

## 2018-05-06 DIAGNOSIS — I482 Chronic atrial fibrillation, unspecified: Secondary | ICD-10-CM | POA: Diagnosis not present

## 2018-05-07 DIAGNOSIS — J449 Chronic obstructive pulmonary disease, unspecified: Secondary | ICD-10-CM | POA: Diagnosis not present

## 2018-05-07 DIAGNOSIS — M810 Age-related osteoporosis without current pathological fracture: Secondary | ICD-10-CM | POA: Diagnosis not present

## 2018-05-07 DIAGNOSIS — S7011XA Contusion of right thigh, initial encounter: Secondary | ICD-10-CM | POA: Diagnosis not present

## 2018-05-07 DIAGNOSIS — R54 Age-related physical debility: Secondary | ICD-10-CM | POA: Diagnosis not present

## 2018-05-07 DIAGNOSIS — Z7902 Long term (current) use of antithrombotics/antiplatelets: Secondary | ICD-10-CM | POA: Diagnosis not present

## 2018-05-07 DIAGNOSIS — S7010XA Contusion of unspecified thigh, initial encounter: Secondary | ICD-10-CM | POA: Diagnosis not present

## 2018-05-07 DIAGNOSIS — Z043 Encounter for examination and observation following other accident: Secondary | ICD-10-CM | POA: Diagnosis not present

## 2018-05-07 DIAGNOSIS — S42292A Other displaced fracture of upper end of left humerus, initial encounter for closed fracture: Secondary | ICD-10-CM | POA: Diagnosis present

## 2018-05-07 DIAGNOSIS — R402362 Coma scale, best motor response, obeys commands, at arrival to emergency department: Secondary | ICD-10-CM | POA: Diagnosis not present

## 2018-05-07 DIAGNOSIS — Z7901 Long term (current) use of anticoagulants: Secondary | ICD-10-CM | POA: Diagnosis not present

## 2018-05-07 DIAGNOSIS — F329 Major depressive disorder, single episode, unspecified: Secondary | ICD-10-CM | POA: Diagnosis not present

## 2018-05-07 DIAGNOSIS — I482 Chronic atrial fibrillation, unspecified: Secondary | ICD-10-CM | POA: Diagnosis not present

## 2018-05-07 DIAGNOSIS — W19XXXA Unspecified fall, initial encounter: Secondary | ICD-10-CM | POA: Diagnosis not present

## 2018-05-07 DIAGNOSIS — S12120K Other displaced dens fracture, subsequent encounter for fracture with nonunion: Secondary | ICD-10-CM | POA: Diagnosis not present

## 2018-05-07 DIAGNOSIS — I4891 Unspecified atrial fibrillation: Secondary | ICD-10-CM | POA: Diagnosis not present

## 2018-05-07 DIAGNOSIS — M25511 Pain in right shoulder: Secondary | ICD-10-CM | POA: Diagnosis not present

## 2018-05-07 DIAGNOSIS — N289 Disorder of kidney and ureter, unspecified: Secondary | ICD-10-CM | POA: Diagnosis not present

## 2018-05-07 DIAGNOSIS — I5181 Takotsubo syndrome: Secondary | ICD-10-CM | POA: Diagnosis not present

## 2018-05-07 DIAGNOSIS — S42352A Displaced comminuted fracture of shaft of humerus, left arm, initial encounter for closed fracture: Secondary | ICD-10-CM | POA: Diagnosis not present

## 2018-05-07 DIAGNOSIS — R000 Tachycardia, unspecified: Secondary | ICD-10-CM | POA: Diagnosis not present

## 2018-05-07 DIAGNOSIS — E876 Hypokalemia: Secondary | ICD-10-CM | POA: Diagnosis not present

## 2018-05-07 DIAGNOSIS — R296 Repeated falls: Secondary | ICD-10-CM | POA: Diagnosis not present

## 2018-05-07 DIAGNOSIS — Z7189 Other specified counseling: Secondary | ICD-10-CM | POA: Diagnosis not present

## 2018-05-07 DIAGNOSIS — N200 Calculus of kidney: Secondary | ICD-10-CM | POA: Diagnosis not present

## 2018-05-07 DIAGNOSIS — S42202A Unspecified fracture of upper end of left humerus, initial encounter for closed fracture: Secondary | ICD-10-CM | POA: Diagnosis not present

## 2018-05-07 DIAGNOSIS — M25522 Pain in left elbow: Secondary | ICD-10-CM | POA: Diagnosis not present

## 2018-05-07 DIAGNOSIS — R58 Hemorrhage, not elsewhere classified: Secondary | ICD-10-CM | POA: Diagnosis not present

## 2018-05-07 DIAGNOSIS — S79919A Unspecified injury of unspecified hip, initial encounter: Secondary | ICD-10-CM | POA: Diagnosis not present

## 2018-05-07 DIAGNOSIS — N281 Cyst of kidney, acquired: Secondary | ICD-10-CM | POA: Diagnosis not present

## 2018-05-07 DIAGNOSIS — Z743 Need for continuous supervision: Secondary | ICD-10-CM | POA: Diagnosis not present

## 2018-05-07 DIAGNOSIS — M25551 Pain in right hip: Secondary | ICD-10-CM | POA: Diagnosis not present

## 2018-05-07 DIAGNOSIS — E279 Disorder of adrenal gland, unspecified: Secondary | ICD-10-CM | POA: Diagnosis not present

## 2018-05-07 DIAGNOSIS — M3500 Sicca syndrome, unspecified: Secondary | ICD-10-CM | POA: Diagnosis not present

## 2018-05-07 DIAGNOSIS — I444 Left anterior fascicular block: Secondary | ICD-10-CM | POA: Diagnosis not present

## 2018-05-07 DIAGNOSIS — F419 Anxiety disorder, unspecified: Secondary | ICD-10-CM | POA: Diagnosis not present

## 2018-05-07 DIAGNOSIS — I959 Hypotension, unspecified: Secondary | ICD-10-CM | POA: Diagnosis not present

## 2018-05-07 DIAGNOSIS — D62 Acute posthemorrhagic anemia: Secondary | ICD-10-CM | POA: Diagnosis not present

## 2018-05-07 DIAGNOSIS — I493 Ventricular premature depolarization: Secondary | ICD-10-CM | POA: Diagnosis not present

## 2018-05-07 DIAGNOSIS — W010XXA Fall on same level from slipping, tripping and stumbling without subsequent striking against object, initial encounter: Secondary | ICD-10-CM | POA: Diagnosis not present

## 2018-05-14 DIAGNOSIS — L89522 Pressure ulcer of left ankle, stage 2: Secondary | ICD-10-CM | POA: Diagnosis not present

## 2018-05-14 DIAGNOSIS — Z452 Encounter for adjustment and management of vascular access device: Secondary | ICD-10-CM | POA: Diagnosis not present

## 2018-05-14 DIAGNOSIS — L89892 Pressure ulcer of other site, stage 2: Secondary | ICD-10-CM | POA: Diagnosis not present

## 2018-05-14 DIAGNOSIS — A401 Sepsis due to streptococcus, group B: Secondary | ICD-10-CM | POA: Diagnosis not present

## 2018-05-14 DIAGNOSIS — L89512 Pressure ulcer of right ankle, stage 2: Secondary | ICD-10-CM | POA: Diagnosis not present

## 2018-05-14 DIAGNOSIS — I482 Chronic atrial fibrillation, unspecified: Secondary | ICD-10-CM | POA: Diagnosis not present

## 2018-05-16 DIAGNOSIS — S7011XA Contusion of right thigh, initial encounter: Secondary | ICD-10-CM | POA: Diagnosis not present

## 2018-05-16 DIAGNOSIS — Z7901 Long term (current) use of anticoagulants: Secondary | ICD-10-CM | POA: Diagnosis not present

## 2018-05-16 DIAGNOSIS — Z743 Need for continuous supervision: Secondary | ICD-10-CM | POA: Diagnosis not present

## 2018-05-16 DIAGNOSIS — I482 Chronic atrial fibrillation, unspecified: Secondary | ICD-10-CM | POA: Diagnosis not present

## 2018-05-16 DIAGNOSIS — I959 Hypotension, unspecified: Secondary | ICD-10-CM | POA: Diagnosis not present

## 2018-05-16 DIAGNOSIS — M810 Age-related osteoporosis without current pathological fracture: Secondary | ICD-10-CM | POA: Diagnosis not present

## 2018-05-16 DIAGNOSIS — I4891 Unspecified atrial fibrillation: Secondary | ICD-10-CM | POA: Diagnosis not present

## 2018-05-16 DIAGNOSIS — J449 Chronic obstructive pulmonary disease, unspecified: Secondary | ICD-10-CM | POA: Diagnosis not present

## 2018-05-16 DIAGNOSIS — D62 Acute posthemorrhagic anemia: Secondary | ICD-10-CM | POA: Diagnosis not present

## 2018-05-16 DIAGNOSIS — J45909 Unspecified asthma, uncomplicated: Secondary | ICD-10-CM | POA: Diagnosis not present

## 2018-05-17 DIAGNOSIS — F419 Anxiety disorder, unspecified: Secondary | ICD-10-CM | POA: Diagnosis present

## 2018-05-17 DIAGNOSIS — S7011XA Contusion of right thigh, initial encounter: Secondary | ICD-10-CM | POA: Diagnosis not present

## 2018-05-17 DIAGNOSIS — F329 Major depressive disorder, single episode, unspecified: Secondary | ICD-10-CM | POA: Diagnosis present

## 2018-05-17 DIAGNOSIS — D62 Acute posthemorrhagic anemia: Secondary | ICD-10-CM | POA: Diagnosis not present

## 2018-05-17 DIAGNOSIS — S42202A Unspecified fracture of upper end of left humerus, initial encounter for closed fracture: Secondary | ICD-10-CM | POA: Diagnosis not present

## 2018-05-17 DIAGNOSIS — M3500 Sicca syndrome, unspecified: Secondary | ICD-10-CM | POA: Diagnosis present

## 2018-05-17 DIAGNOSIS — I10 Essential (primary) hypertension: Secondary | ICD-10-CM | POA: Diagnosis not present

## 2018-05-17 DIAGNOSIS — I4891 Unspecified atrial fibrillation: Secondary | ICD-10-CM | POA: Diagnosis not present

## 2018-05-17 DIAGNOSIS — Z09 Encounter for follow-up examination after completed treatment for conditions other than malignant neoplasm: Secondary | ICD-10-CM | POA: Diagnosis not present

## 2018-05-17 DIAGNOSIS — J449 Chronic obstructive pulmonary disease, unspecified: Secondary | ICD-10-CM | POA: Diagnosis present

## 2018-05-17 DIAGNOSIS — H409 Unspecified glaucoma: Secondary | ICD-10-CM | POA: Diagnosis not present

## 2018-05-17 DIAGNOSIS — Z79891 Long term (current) use of opiate analgesic: Secondary | ICD-10-CM | POA: Diagnosis not present

## 2018-05-17 DIAGNOSIS — I482 Chronic atrial fibrillation, unspecified: Secondary | ICD-10-CM | POA: Diagnosis not present

## 2018-05-17 DIAGNOSIS — R54 Age-related physical debility: Secondary | ICD-10-CM | POA: Diagnosis not present

## 2018-05-17 DIAGNOSIS — S7010XA Contusion of unspecified thigh, initial encounter: Secondary | ICD-10-CM | POA: Diagnosis not present

## 2018-05-17 DIAGNOSIS — Z8781 Personal history of (healed) traumatic fracture: Secondary | ICD-10-CM | POA: Diagnosis not present

## 2018-05-17 DIAGNOSIS — I959 Hypotension, unspecified: Secondary | ICD-10-CM | POA: Diagnosis present

## 2018-05-17 DIAGNOSIS — Z7901 Long term (current) use of anticoagulants: Secondary | ICD-10-CM | POA: Diagnosis not present

## 2018-05-17 DIAGNOSIS — M810 Age-related osteoporosis without current pathological fracture: Secondary | ICD-10-CM | POA: Diagnosis present

## 2018-05-17 DIAGNOSIS — W19XXXA Unspecified fall, initial encounter: Secondary | ICD-10-CM | POA: Diagnosis not present

## 2018-05-21 DIAGNOSIS — A401 Sepsis due to streptococcus, group B: Secondary | ICD-10-CM | POA: Diagnosis not present

## 2018-05-21 DIAGNOSIS — I482 Chronic atrial fibrillation, unspecified: Secondary | ICD-10-CM | POA: Diagnosis not present

## 2018-05-21 DIAGNOSIS — L89522 Pressure ulcer of left ankle, stage 2: Secondary | ICD-10-CM | POA: Diagnosis not present

## 2018-05-21 DIAGNOSIS — L89512 Pressure ulcer of right ankle, stage 2: Secondary | ICD-10-CM | POA: Diagnosis not present

## 2018-05-21 DIAGNOSIS — L89892 Pressure ulcer of other site, stage 2: Secondary | ICD-10-CM | POA: Diagnosis not present

## 2018-05-21 DIAGNOSIS — Z452 Encounter for adjustment and management of vascular access device: Secondary | ICD-10-CM | POA: Diagnosis not present

## 2018-05-22 DIAGNOSIS — I482 Chronic atrial fibrillation, unspecified: Secondary | ICD-10-CM | POA: Diagnosis not present

## 2018-05-22 DIAGNOSIS — L89892 Pressure ulcer of other site, stage 2: Secondary | ICD-10-CM | POA: Diagnosis not present

## 2018-05-22 DIAGNOSIS — L89522 Pressure ulcer of left ankle, stage 2: Secondary | ICD-10-CM | POA: Diagnosis not present

## 2018-05-22 DIAGNOSIS — A401 Sepsis due to streptococcus, group B: Secondary | ICD-10-CM | POA: Diagnosis not present

## 2018-05-22 DIAGNOSIS — L89512 Pressure ulcer of right ankle, stage 2: Secondary | ICD-10-CM | POA: Diagnosis not present

## 2018-05-22 DIAGNOSIS — Z452 Encounter for adjustment and management of vascular access device: Secondary | ICD-10-CM | POA: Diagnosis not present

## 2018-05-23 DIAGNOSIS — L89892 Pressure ulcer of other site, stage 2: Secondary | ICD-10-CM | POA: Diagnosis not present

## 2018-05-23 DIAGNOSIS — Z452 Encounter for adjustment and management of vascular access device: Secondary | ICD-10-CM | POA: Diagnosis not present

## 2018-05-23 DIAGNOSIS — L89522 Pressure ulcer of left ankle, stage 2: Secondary | ICD-10-CM | POA: Diagnosis not present

## 2018-05-23 DIAGNOSIS — A401 Sepsis due to streptococcus, group B: Secondary | ICD-10-CM | POA: Diagnosis not present

## 2018-05-23 DIAGNOSIS — L89512 Pressure ulcer of right ankle, stage 2: Secondary | ICD-10-CM | POA: Diagnosis not present

## 2018-05-23 DIAGNOSIS — I482 Chronic atrial fibrillation, unspecified: Secondary | ICD-10-CM | POA: Diagnosis not present

## 2018-05-24 DIAGNOSIS — L89512 Pressure ulcer of right ankle, stage 2: Secondary | ICD-10-CM | POA: Diagnosis not present

## 2018-05-24 DIAGNOSIS — Z452 Encounter for adjustment and management of vascular access device: Secondary | ICD-10-CM | POA: Diagnosis not present

## 2018-05-24 DIAGNOSIS — A401 Sepsis due to streptococcus, group B: Secondary | ICD-10-CM | POA: Diagnosis not present

## 2018-05-24 DIAGNOSIS — I482 Chronic atrial fibrillation, unspecified: Secondary | ICD-10-CM | POA: Diagnosis not present

## 2018-05-24 DIAGNOSIS — L89522 Pressure ulcer of left ankle, stage 2: Secondary | ICD-10-CM | POA: Diagnosis not present

## 2018-05-24 DIAGNOSIS — L89892 Pressure ulcer of other site, stage 2: Secondary | ICD-10-CM | POA: Diagnosis not present

## 2018-05-25 DIAGNOSIS — A401 Sepsis due to streptococcus, group B: Secondary | ICD-10-CM | POA: Diagnosis not present

## 2018-05-25 DIAGNOSIS — L89512 Pressure ulcer of right ankle, stage 2: Secondary | ICD-10-CM | POA: Diagnosis not present

## 2018-05-25 DIAGNOSIS — I482 Chronic atrial fibrillation, unspecified: Secondary | ICD-10-CM | POA: Diagnosis not present

## 2018-05-25 DIAGNOSIS — Z452 Encounter for adjustment and management of vascular access device: Secondary | ICD-10-CM | POA: Diagnosis not present

## 2018-05-25 DIAGNOSIS — L89892 Pressure ulcer of other site, stage 2: Secondary | ICD-10-CM | POA: Diagnosis not present

## 2018-05-25 DIAGNOSIS — L89522 Pressure ulcer of left ankle, stage 2: Secondary | ICD-10-CM | POA: Diagnosis not present

## 2018-05-28 DIAGNOSIS — I482 Chronic atrial fibrillation, unspecified: Secondary | ICD-10-CM | POA: Diagnosis not present

## 2018-05-28 DIAGNOSIS — L89892 Pressure ulcer of other site, stage 2: Secondary | ICD-10-CM | POA: Diagnosis not present

## 2018-05-28 DIAGNOSIS — A401 Sepsis due to streptococcus, group B: Secondary | ICD-10-CM | POA: Diagnosis not present

## 2018-05-28 DIAGNOSIS — L89522 Pressure ulcer of left ankle, stage 2: Secondary | ICD-10-CM | POA: Diagnosis not present

## 2018-05-28 DIAGNOSIS — Z452 Encounter for adjustment and management of vascular access device: Secondary | ICD-10-CM | POA: Diagnosis not present

## 2018-05-28 DIAGNOSIS — L89512 Pressure ulcer of right ankle, stage 2: Secondary | ICD-10-CM | POA: Diagnosis not present

## 2018-05-30 DIAGNOSIS — Z7901 Long term (current) use of anticoagulants: Secondary | ICD-10-CM | POA: Diagnosis not present

## 2018-05-30 DIAGNOSIS — S7011XD Contusion of right thigh, subsequent encounter: Secondary | ICD-10-CM | POA: Diagnosis not present

## 2018-05-30 DIAGNOSIS — Z9181 History of falling: Secondary | ICD-10-CM | POA: Diagnosis not present

## 2018-05-30 DIAGNOSIS — J9601 Acute respiratory failure with hypoxia: Secondary | ICD-10-CM | POA: Diagnosis not present

## 2018-05-30 DIAGNOSIS — L89892 Pressure ulcer of other site, stage 2: Secondary | ICD-10-CM | POA: Diagnosis not present

## 2018-05-30 DIAGNOSIS — M3500 Sicca syndrome, unspecified: Secondary | ICD-10-CM | POA: Diagnosis not present

## 2018-05-30 DIAGNOSIS — J449 Chronic obstructive pulmonary disease, unspecified: Secondary | ICD-10-CM | POA: Diagnosis not present

## 2018-05-30 DIAGNOSIS — I5082 Biventricular heart failure: Secondary | ICD-10-CM | POA: Diagnosis not present

## 2018-05-30 DIAGNOSIS — I5181 Takotsubo syndrome: Secondary | ICD-10-CM | POA: Diagnosis not present

## 2018-05-30 DIAGNOSIS — I482 Chronic atrial fibrillation, unspecified: Secondary | ICD-10-CM | POA: Diagnosis not present

## 2018-05-31 DIAGNOSIS — J9601 Acute respiratory failure with hypoxia: Secondary | ICD-10-CM | POA: Diagnosis not present

## 2018-05-31 DIAGNOSIS — L89892 Pressure ulcer of other site, stage 2: Secondary | ICD-10-CM | POA: Diagnosis not present

## 2018-05-31 DIAGNOSIS — I482 Chronic atrial fibrillation, unspecified: Secondary | ICD-10-CM | POA: Diagnosis not present

## 2018-05-31 DIAGNOSIS — I5082 Biventricular heart failure: Secondary | ICD-10-CM | POA: Diagnosis not present

## 2018-05-31 DIAGNOSIS — J449 Chronic obstructive pulmonary disease, unspecified: Secondary | ICD-10-CM | POA: Diagnosis not present

## 2018-05-31 DIAGNOSIS — S7011XD Contusion of right thigh, subsequent encounter: Secondary | ICD-10-CM | POA: Diagnosis not present

## 2018-06-02 DIAGNOSIS — S7011XD Contusion of right thigh, subsequent encounter: Secondary | ICD-10-CM | POA: Diagnosis not present

## 2018-06-02 DIAGNOSIS — L89892 Pressure ulcer of other site, stage 2: Secondary | ICD-10-CM | POA: Diagnosis not present

## 2018-06-02 DIAGNOSIS — I5082 Biventricular heart failure: Secondary | ICD-10-CM | POA: Diagnosis not present

## 2018-06-02 DIAGNOSIS — J9601 Acute respiratory failure with hypoxia: Secondary | ICD-10-CM | POA: Diagnosis not present

## 2018-06-02 DIAGNOSIS — J449 Chronic obstructive pulmonary disease, unspecified: Secondary | ICD-10-CM | POA: Diagnosis not present

## 2018-06-02 DIAGNOSIS — I482 Chronic atrial fibrillation, unspecified: Secondary | ICD-10-CM | POA: Diagnosis not present

## 2018-06-04 DIAGNOSIS — S7011XD Contusion of right thigh, subsequent encounter: Secondary | ICD-10-CM | POA: Diagnosis not present

## 2018-06-04 DIAGNOSIS — J9601 Acute respiratory failure with hypoxia: Secondary | ICD-10-CM | POA: Diagnosis not present

## 2018-06-04 DIAGNOSIS — I5082 Biventricular heart failure: Secondary | ICD-10-CM | POA: Diagnosis not present

## 2018-06-04 DIAGNOSIS — I482 Chronic atrial fibrillation, unspecified: Secondary | ICD-10-CM | POA: Diagnosis not present

## 2018-06-04 DIAGNOSIS — L89892 Pressure ulcer of other site, stage 2: Secondary | ICD-10-CM | POA: Diagnosis not present

## 2018-06-04 DIAGNOSIS — J449 Chronic obstructive pulmonary disease, unspecified: Secondary | ICD-10-CM | POA: Diagnosis not present

## 2018-06-07 DIAGNOSIS — J9601 Acute respiratory failure with hypoxia: Secondary | ICD-10-CM | POA: Diagnosis not present

## 2018-06-07 DIAGNOSIS — I482 Chronic atrial fibrillation, unspecified: Secondary | ICD-10-CM | POA: Diagnosis not present

## 2018-06-07 DIAGNOSIS — S7011XD Contusion of right thigh, subsequent encounter: Secondary | ICD-10-CM | POA: Diagnosis not present

## 2018-06-07 DIAGNOSIS — J449 Chronic obstructive pulmonary disease, unspecified: Secondary | ICD-10-CM | POA: Diagnosis not present

## 2018-06-07 DIAGNOSIS — L89892 Pressure ulcer of other site, stage 2: Secondary | ICD-10-CM | POA: Diagnosis not present

## 2018-06-07 DIAGNOSIS — I5082 Biventricular heart failure: Secondary | ICD-10-CM | POA: Diagnosis not present

## 2018-06-09 DIAGNOSIS — I482 Chronic atrial fibrillation, unspecified: Secondary | ICD-10-CM | POA: Diagnosis not present

## 2018-06-09 DIAGNOSIS — S7011XD Contusion of right thigh, subsequent encounter: Secondary | ICD-10-CM | POA: Diagnosis not present

## 2018-06-09 DIAGNOSIS — J449 Chronic obstructive pulmonary disease, unspecified: Secondary | ICD-10-CM | POA: Diagnosis not present

## 2018-06-09 DIAGNOSIS — L89892 Pressure ulcer of other site, stage 2: Secondary | ICD-10-CM | POA: Diagnosis not present

## 2018-06-09 DIAGNOSIS — I5082 Biventricular heart failure: Secondary | ICD-10-CM | POA: Diagnosis not present

## 2018-06-09 DIAGNOSIS — J9601 Acute respiratory failure with hypoxia: Secondary | ICD-10-CM | POA: Diagnosis not present

## 2018-06-11 DIAGNOSIS — L89892 Pressure ulcer of other site, stage 2: Secondary | ICD-10-CM | POA: Diagnosis not present

## 2018-06-11 DIAGNOSIS — I5082 Biventricular heart failure: Secondary | ICD-10-CM | POA: Diagnosis not present

## 2018-06-11 DIAGNOSIS — J449 Chronic obstructive pulmonary disease, unspecified: Secondary | ICD-10-CM | POA: Diagnosis not present

## 2018-06-11 DIAGNOSIS — J9601 Acute respiratory failure with hypoxia: Secondary | ICD-10-CM | POA: Diagnosis not present

## 2018-06-11 DIAGNOSIS — S7011XD Contusion of right thigh, subsequent encounter: Secondary | ICD-10-CM | POA: Diagnosis not present

## 2018-06-11 DIAGNOSIS — I482 Chronic atrial fibrillation, unspecified: Secondary | ICD-10-CM | POA: Diagnosis not present

## 2018-06-14 DIAGNOSIS — J9601 Acute respiratory failure with hypoxia: Secondary | ICD-10-CM | POA: Diagnosis not present

## 2018-06-14 DIAGNOSIS — I5082 Biventricular heart failure: Secondary | ICD-10-CM | POA: Diagnosis not present

## 2018-06-14 DIAGNOSIS — S7011XD Contusion of right thigh, subsequent encounter: Secondary | ICD-10-CM | POA: Diagnosis not present

## 2018-06-14 DIAGNOSIS — L89892 Pressure ulcer of other site, stage 2: Secondary | ICD-10-CM | POA: Diagnosis not present

## 2018-06-14 DIAGNOSIS — I482 Chronic atrial fibrillation, unspecified: Secondary | ICD-10-CM | POA: Diagnosis not present

## 2018-06-14 DIAGNOSIS — J449 Chronic obstructive pulmonary disease, unspecified: Secondary | ICD-10-CM | POA: Diagnosis not present

## 2018-06-16 DIAGNOSIS — I5082 Biventricular heart failure: Secondary | ICD-10-CM | POA: Diagnosis not present

## 2018-06-16 DIAGNOSIS — L89892 Pressure ulcer of other site, stage 2: Secondary | ICD-10-CM | POA: Diagnosis not present

## 2018-06-16 DIAGNOSIS — J9601 Acute respiratory failure with hypoxia: Secondary | ICD-10-CM | POA: Diagnosis not present

## 2018-06-16 DIAGNOSIS — S7011XD Contusion of right thigh, subsequent encounter: Secondary | ICD-10-CM | POA: Diagnosis not present

## 2018-06-16 DIAGNOSIS — I482 Chronic atrial fibrillation, unspecified: Secondary | ICD-10-CM | POA: Diagnosis not present

## 2018-06-16 DIAGNOSIS — J449 Chronic obstructive pulmonary disease, unspecified: Secondary | ICD-10-CM | POA: Diagnosis not present

## 2018-06-17 DIAGNOSIS — M1990 Unspecified osteoarthritis, unspecified site: Secondary | ICD-10-CM | POA: Diagnosis not present

## 2018-06-18 DIAGNOSIS — J449 Chronic obstructive pulmonary disease, unspecified: Secondary | ICD-10-CM | POA: Diagnosis not present

## 2018-06-18 DIAGNOSIS — I482 Chronic atrial fibrillation, unspecified: Secondary | ICD-10-CM | POA: Diagnosis not present

## 2018-06-18 DIAGNOSIS — I5082 Biventricular heart failure: Secondary | ICD-10-CM | POA: Diagnosis not present

## 2018-06-18 DIAGNOSIS — L89892 Pressure ulcer of other site, stage 2: Secondary | ICD-10-CM | POA: Diagnosis not present

## 2018-06-18 DIAGNOSIS — S7011XD Contusion of right thigh, subsequent encounter: Secondary | ICD-10-CM | POA: Diagnosis not present

## 2018-06-18 DIAGNOSIS — J9601 Acute respiratory failure with hypoxia: Secondary | ICD-10-CM | POA: Diagnosis not present

## 2018-06-21 DIAGNOSIS — L89892 Pressure ulcer of other site, stage 2: Secondary | ICD-10-CM | POA: Diagnosis not present

## 2018-06-21 DIAGNOSIS — S7011XD Contusion of right thigh, subsequent encounter: Secondary | ICD-10-CM | POA: Diagnosis not present

## 2018-06-21 DIAGNOSIS — I5082 Biventricular heart failure: Secondary | ICD-10-CM | POA: Diagnosis not present

## 2018-06-21 DIAGNOSIS — I482 Chronic atrial fibrillation, unspecified: Secondary | ICD-10-CM | POA: Diagnosis not present

## 2018-06-21 DIAGNOSIS — J9601 Acute respiratory failure with hypoxia: Secondary | ICD-10-CM | POA: Diagnosis not present

## 2018-06-21 DIAGNOSIS — J449 Chronic obstructive pulmonary disease, unspecified: Secondary | ICD-10-CM | POA: Diagnosis not present

## 2018-06-23 DIAGNOSIS — J449 Chronic obstructive pulmonary disease, unspecified: Secondary | ICD-10-CM | POA: Diagnosis not present

## 2018-06-23 DIAGNOSIS — I482 Chronic atrial fibrillation, unspecified: Secondary | ICD-10-CM | POA: Diagnosis not present

## 2018-06-23 DIAGNOSIS — I5082 Biventricular heart failure: Secondary | ICD-10-CM | POA: Diagnosis not present

## 2018-06-23 DIAGNOSIS — L89892 Pressure ulcer of other site, stage 2: Secondary | ICD-10-CM | POA: Diagnosis not present

## 2018-06-23 DIAGNOSIS — J9601 Acute respiratory failure with hypoxia: Secondary | ICD-10-CM | POA: Diagnosis not present

## 2018-06-23 DIAGNOSIS — S7011XD Contusion of right thigh, subsequent encounter: Secondary | ICD-10-CM | POA: Diagnosis not present

## 2018-06-25 DIAGNOSIS — S7011XD Contusion of right thigh, subsequent encounter: Secondary | ICD-10-CM | POA: Diagnosis not present

## 2018-06-25 DIAGNOSIS — I5082 Biventricular heart failure: Secondary | ICD-10-CM | POA: Diagnosis not present

## 2018-06-25 DIAGNOSIS — I482 Chronic atrial fibrillation, unspecified: Secondary | ICD-10-CM | POA: Diagnosis not present

## 2018-06-25 DIAGNOSIS — J449 Chronic obstructive pulmonary disease, unspecified: Secondary | ICD-10-CM | POA: Diagnosis not present

## 2018-06-25 DIAGNOSIS — L89892 Pressure ulcer of other site, stage 2: Secondary | ICD-10-CM | POA: Diagnosis not present

## 2018-06-25 DIAGNOSIS — J9601 Acute respiratory failure with hypoxia: Secondary | ICD-10-CM | POA: Diagnosis not present

## 2018-06-28 DIAGNOSIS — I5082 Biventricular heart failure: Secondary | ICD-10-CM | POA: Diagnosis not present

## 2018-06-28 DIAGNOSIS — J449 Chronic obstructive pulmonary disease, unspecified: Secondary | ICD-10-CM | POA: Diagnosis not present

## 2018-06-28 DIAGNOSIS — J9601 Acute respiratory failure with hypoxia: Secondary | ICD-10-CM | POA: Diagnosis not present

## 2018-06-28 DIAGNOSIS — S7011XD Contusion of right thigh, subsequent encounter: Secondary | ICD-10-CM | POA: Diagnosis not present

## 2018-06-28 DIAGNOSIS — L89892 Pressure ulcer of other site, stage 2: Secondary | ICD-10-CM | POA: Diagnosis not present

## 2018-06-28 DIAGNOSIS — I482 Chronic atrial fibrillation, unspecified: Secondary | ICD-10-CM | POA: Diagnosis not present

## 2018-06-29 DIAGNOSIS — J9601 Acute respiratory failure with hypoxia: Secondary | ICD-10-CM | POA: Diagnosis not present

## 2018-06-29 DIAGNOSIS — Z7901 Long term (current) use of anticoagulants: Secondary | ICD-10-CM | POA: Diagnosis not present

## 2018-06-29 DIAGNOSIS — S7011XD Contusion of right thigh, subsequent encounter: Secondary | ICD-10-CM | POA: Diagnosis not present

## 2018-06-29 DIAGNOSIS — M3500 Sicca syndrome, unspecified: Secondary | ICD-10-CM | POA: Diagnosis not present

## 2018-06-29 DIAGNOSIS — I5181 Takotsubo syndrome: Secondary | ICD-10-CM | POA: Diagnosis not present

## 2018-06-29 DIAGNOSIS — L89892 Pressure ulcer of other site, stage 2: Secondary | ICD-10-CM | POA: Diagnosis not present

## 2018-06-29 DIAGNOSIS — I5082 Biventricular heart failure: Secondary | ICD-10-CM | POA: Diagnosis not present

## 2018-06-29 DIAGNOSIS — I482 Chronic atrial fibrillation, unspecified: Secondary | ICD-10-CM | POA: Diagnosis not present

## 2018-06-29 DIAGNOSIS — J449 Chronic obstructive pulmonary disease, unspecified: Secondary | ICD-10-CM | POA: Diagnosis not present

## 2018-06-29 DIAGNOSIS — Z9181 History of falling: Secondary | ICD-10-CM | POA: Diagnosis not present

## 2018-06-30 DIAGNOSIS — J449 Chronic obstructive pulmonary disease, unspecified: Secondary | ICD-10-CM | POA: Diagnosis not present

## 2018-06-30 DIAGNOSIS — J9601 Acute respiratory failure with hypoxia: Secondary | ICD-10-CM | POA: Diagnosis not present

## 2018-06-30 DIAGNOSIS — I482 Chronic atrial fibrillation, unspecified: Secondary | ICD-10-CM | POA: Diagnosis not present

## 2018-06-30 DIAGNOSIS — L89892 Pressure ulcer of other site, stage 2: Secondary | ICD-10-CM | POA: Diagnosis not present

## 2018-06-30 DIAGNOSIS — I5082 Biventricular heart failure: Secondary | ICD-10-CM | POA: Diagnosis not present

## 2018-06-30 DIAGNOSIS — S7011XD Contusion of right thigh, subsequent encounter: Secondary | ICD-10-CM | POA: Diagnosis not present

## 2018-07-02 DIAGNOSIS — N390 Urinary tract infection, site not specified: Secondary | ICD-10-CM | POA: Diagnosis not present

## 2018-07-02 DIAGNOSIS — I5082 Biventricular heart failure: Secondary | ICD-10-CM | POA: Diagnosis not present

## 2018-07-02 DIAGNOSIS — I482 Chronic atrial fibrillation, unspecified: Secondary | ICD-10-CM | POA: Diagnosis not present

## 2018-07-02 DIAGNOSIS — J9601 Acute respiratory failure with hypoxia: Secondary | ICD-10-CM | POA: Diagnosis not present

## 2018-07-02 DIAGNOSIS — Z743 Need for continuous supervision: Secondary | ICD-10-CM | POA: Diagnosis not present

## 2018-07-02 DIAGNOSIS — S7011XD Contusion of right thigh, subsequent encounter: Secondary | ICD-10-CM | POA: Diagnosis not present

## 2018-07-02 DIAGNOSIS — I4891 Unspecified atrial fibrillation: Secondary | ICD-10-CM | POA: Diagnosis not present

## 2018-07-02 DIAGNOSIS — R0600 Dyspnea, unspecified: Secondary | ICD-10-CM | POA: Diagnosis not present

## 2018-07-02 DIAGNOSIS — L89892 Pressure ulcer of other site, stage 2: Secondary | ICD-10-CM | POA: Diagnosis not present

## 2018-07-02 DIAGNOSIS — Z20828 Contact with and (suspected) exposure to other viral communicable diseases: Secondary | ICD-10-CM | POA: Diagnosis not present

## 2018-07-02 DIAGNOSIS — J449 Chronic obstructive pulmonary disease, unspecified: Secondary | ICD-10-CM | POA: Diagnosis not present

## 2018-07-02 DIAGNOSIS — R030 Elevated blood-pressure reading, without diagnosis of hypertension: Secondary | ICD-10-CM | POA: Diagnosis not present

## 2018-07-03 DIAGNOSIS — J9601 Acute respiratory failure with hypoxia: Secondary | ICD-10-CM | POA: Diagnosis not present

## 2018-07-03 DIAGNOSIS — J449 Chronic obstructive pulmonary disease, unspecified: Secondary | ICD-10-CM | POA: Diagnosis not present

## 2018-07-03 DIAGNOSIS — L89892 Pressure ulcer of other site, stage 2: Secondary | ICD-10-CM | POA: Diagnosis not present

## 2018-07-03 DIAGNOSIS — I5082 Biventricular heart failure: Secondary | ICD-10-CM | POA: Diagnosis not present

## 2018-07-03 DIAGNOSIS — S7011XD Contusion of right thigh, subsequent encounter: Secondary | ICD-10-CM | POA: Diagnosis not present

## 2018-07-03 DIAGNOSIS — I482 Chronic atrial fibrillation, unspecified: Secondary | ICD-10-CM | POA: Diagnosis not present

## 2018-07-05 DIAGNOSIS — J449 Chronic obstructive pulmonary disease, unspecified: Secondary | ICD-10-CM | POA: Diagnosis not present

## 2018-07-05 DIAGNOSIS — I482 Chronic atrial fibrillation, unspecified: Secondary | ICD-10-CM | POA: Diagnosis not present

## 2018-07-05 DIAGNOSIS — I5082 Biventricular heart failure: Secondary | ICD-10-CM | POA: Diagnosis not present

## 2018-07-05 DIAGNOSIS — F419 Anxiety disorder, unspecified: Secondary | ICD-10-CM | POA: Diagnosis not present

## 2018-07-05 DIAGNOSIS — S7011XD Contusion of right thigh, subsequent encounter: Secondary | ICD-10-CM | POA: Diagnosis not present

## 2018-07-05 DIAGNOSIS — J9601 Acute respiratory failure with hypoxia: Secondary | ICD-10-CM | POA: Diagnosis not present

## 2018-07-05 DIAGNOSIS — L89892 Pressure ulcer of other site, stage 2: Secondary | ICD-10-CM | POA: Diagnosis not present

## 2018-07-07 DIAGNOSIS — I482 Chronic atrial fibrillation, unspecified: Secondary | ICD-10-CM | POA: Diagnosis not present

## 2018-07-07 DIAGNOSIS — L89892 Pressure ulcer of other site, stage 2: Secondary | ICD-10-CM | POA: Diagnosis not present

## 2018-07-07 DIAGNOSIS — S7011XD Contusion of right thigh, subsequent encounter: Secondary | ICD-10-CM | POA: Diagnosis not present

## 2018-07-07 DIAGNOSIS — I5082 Biventricular heart failure: Secondary | ICD-10-CM | POA: Diagnosis not present

## 2018-07-07 DIAGNOSIS — J9601 Acute respiratory failure with hypoxia: Secondary | ICD-10-CM | POA: Diagnosis not present

## 2018-07-07 DIAGNOSIS — J449 Chronic obstructive pulmonary disease, unspecified: Secondary | ICD-10-CM | POA: Diagnosis not present

## 2018-07-09 DIAGNOSIS — I5082 Biventricular heart failure: Secondary | ICD-10-CM | POA: Diagnosis not present

## 2018-07-09 DIAGNOSIS — J449 Chronic obstructive pulmonary disease, unspecified: Secondary | ICD-10-CM | POA: Diagnosis not present

## 2018-07-09 DIAGNOSIS — I482 Chronic atrial fibrillation, unspecified: Secondary | ICD-10-CM | POA: Diagnosis not present

## 2018-07-09 DIAGNOSIS — S7011XD Contusion of right thigh, subsequent encounter: Secondary | ICD-10-CM | POA: Diagnosis not present

## 2018-07-09 DIAGNOSIS — L89892 Pressure ulcer of other site, stage 2: Secondary | ICD-10-CM | POA: Diagnosis not present

## 2018-07-09 DIAGNOSIS — J9601 Acute respiratory failure with hypoxia: Secondary | ICD-10-CM | POA: Diagnosis not present

## 2018-07-12 DIAGNOSIS — L89892 Pressure ulcer of other site, stage 2: Secondary | ICD-10-CM | POA: Diagnosis not present

## 2018-07-12 DIAGNOSIS — S7011XD Contusion of right thigh, subsequent encounter: Secondary | ICD-10-CM | POA: Diagnosis not present

## 2018-07-12 DIAGNOSIS — R410 Disorientation, unspecified: Secondary | ICD-10-CM | POA: Diagnosis not present

## 2018-07-12 DIAGNOSIS — I5082 Biventricular heart failure: Secondary | ICD-10-CM | POA: Diagnosis not present

## 2018-07-12 DIAGNOSIS — Z87440 Personal history of urinary (tract) infections: Secondary | ICD-10-CM | POA: Diagnosis not present

## 2018-07-12 DIAGNOSIS — J449 Chronic obstructive pulmonary disease, unspecified: Secondary | ICD-10-CM | POA: Diagnosis not present

## 2018-07-12 DIAGNOSIS — R5383 Other fatigue: Secondary | ICD-10-CM | POA: Diagnosis not present

## 2018-07-12 DIAGNOSIS — I482 Chronic atrial fibrillation, unspecified: Secondary | ICD-10-CM | POA: Diagnosis not present

## 2018-07-12 DIAGNOSIS — J9601 Acute respiratory failure with hypoxia: Secondary | ICD-10-CM | POA: Diagnosis not present

## 2018-07-14 DIAGNOSIS — Z87440 Personal history of urinary (tract) infections: Secondary | ICD-10-CM | POA: Diagnosis not present

## 2018-07-14 DIAGNOSIS — S7011XD Contusion of right thigh, subsequent encounter: Secondary | ICD-10-CM | POA: Diagnosis not present

## 2018-07-14 DIAGNOSIS — I5082 Biventricular heart failure: Secondary | ICD-10-CM | POA: Diagnosis not present

## 2018-07-14 DIAGNOSIS — J9601 Acute respiratory failure with hypoxia: Secondary | ICD-10-CM | POA: Diagnosis not present

## 2018-07-14 DIAGNOSIS — L89892 Pressure ulcer of other site, stage 2: Secondary | ICD-10-CM | POA: Diagnosis not present

## 2018-07-14 DIAGNOSIS — I482 Chronic atrial fibrillation, unspecified: Secondary | ICD-10-CM | POA: Diagnosis not present

## 2018-07-14 DIAGNOSIS — J449 Chronic obstructive pulmonary disease, unspecified: Secondary | ICD-10-CM | POA: Diagnosis not present

## 2018-07-14 DIAGNOSIS — R5383 Other fatigue: Secondary | ICD-10-CM | POA: Diagnosis not present

## 2018-07-16 DIAGNOSIS — I5082 Biventricular heart failure: Secondary | ICD-10-CM | POA: Diagnosis not present

## 2018-07-16 DIAGNOSIS — J9601 Acute respiratory failure with hypoxia: Secondary | ICD-10-CM | POA: Diagnosis not present

## 2018-07-16 DIAGNOSIS — L89892 Pressure ulcer of other site, stage 2: Secondary | ICD-10-CM | POA: Diagnosis not present

## 2018-07-16 DIAGNOSIS — S7011XD Contusion of right thigh, subsequent encounter: Secondary | ICD-10-CM | POA: Diagnosis not present

## 2018-07-16 DIAGNOSIS — I482 Chronic atrial fibrillation, unspecified: Secondary | ICD-10-CM | POA: Diagnosis not present

## 2018-07-16 DIAGNOSIS — J449 Chronic obstructive pulmonary disease, unspecified: Secondary | ICD-10-CM | POA: Diagnosis not present

## 2018-07-18 DIAGNOSIS — R2689 Other abnormalities of gait and mobility: Secondary | ICD-10-CM | POA: Diagnosis not present

## 2018-07-18 DIAGNOSIS — I10 Essential (primary) hypertension: Secondary | ICD-10-CM | POA: Diagnosis not present

## 2018-07-18 DIAGNOSIS — S299XXA Unspecified injury of thorax, initial encounter: Secondary | ICD-10-CM | POA: Diagnosis not present

## 2018-07-18 DIAGNOSIS — M549 Dorsalgia, unspecified: Secondary | ICD-10-CM | POA: Diagnosis not present

## 2018-07-18 DIAGNOSIS — J449 Chronic obstructive pulmonary disease, unspecified: Secondary | ICD-10-CM | POA: Diagnosis not present

## 2018-07-18 DIAGNOSIS — G8929 Other chronic pain: Secondary | ICD-10-CM | POA: Diagnosis not present

## 2018-07-18 DIAGNOSIS — S300XXA Contusion of lower back and pelvis, initial encounter: Secondary | ICD-10-CM | POA: Diagnosis not present

## 2018-07-18 DIAGNOSIS — S20211A Contusion of right front wall of thorax, initial encounter: Secondary | ICD-10-CM | POA: Diagnosis not present

## 2018-07-18 DIAGNOSIS — Z7901 Long term (current) use of anticoagulants: Secondary | ICD-10-CM | POA: Diagnosis not present

## 2018-07-18 DIAGNOSIS — M1990 Unspecified osteoarthritis, unspecified site: Secondary | ICD-10-CM | POA: Diagnosis not present

## 2018-07-18 DIAGNOSIS — M3500 Sicca syndrome, unspecified: Secondary | ICD-10-CM | POA: Diagnosis not present

## 2018-07-18 DIAGNOSIS — W1830XA Fall on same level, unspecified, initial encounter: Secondary | ICD-10-CM | POA: Diagnosis not present

## 2018-07-18 DIAGNOSIS — I4891 Unspecified atrial fibrillation: Secondary | ICD-10-CM | POA: Diagnosis not present

## 2018-07-18 DIAGNOSIS — F419 Anxiety disorder, unspecified: Secondary | ICD-10-CM | POA: Diagnosis not present

## 2018-07-18 DIAGNOSIS — Z743 Need for continuous supervision: Secondary | ICD-10-CM | POA: Diagnosis not present

## 2018-07-18 DIAGNOSIS — S20221A Contusion of right back wall of thorax, initial encounter: Secondary | ICD-10-CM | POA: Diagnosis not present

## 2018-07-18 DIAGNOSIS — S0990XA Unspecified injury of head, initial encounter: Secondary | ICD-10-CM | POA: Diagnosis not present

## 2018-07-18 DIAGNOSIS — S40211A Abrasion of right shoulder, initial encounter: Secondary | ICD-10-CM | POA: Diagnosis not present

## 2018-07-18 DIAGNOSIS — Y92012 Bathroom of single-family (private) house as the place of occurrence of the external cause: Secondary | ICD-10-CM | POA: Diagnosis not present

## 2018-07-18 DIAGNOSIS — Y939 Activity, unspecified: Secondary | ICD-10-CM | POA: Diagnosis not present

## 2018-07-19 DIAGNOSIS — S300XXA Contusion of lower back and pelvis, initial encounter: Secondary | ICD-10-CM | POA: Diagnosis not present

## 2018-07-19 DIAGNOSIS — Y92012 Bathroom of single-family (private) house as the place of occurrence of the external cause: Secondary | ICD-10-CM | POA: Diagnosis not present

## 2018-07-19 DIAGNOSIS — S20221A Contusion of right back wall of thorax, initial encounter: Secondary | ICD-10-CM | POA: Diagnosis not present

## 2018-07-19 DIAGNOSIS — W1830XA Fall on same level, unspecified, initial encounter: Secondary | ICD-10-CM | POA: Diagnosis not present

## 2018-07-19 DIAGNOSIS — Y939 Activity, unspecified: Secondary | ICD-10-CM | POA: Diagnosis not present

## 2018-07-20 DIAGNOSIS — S20211A Contusion of right front wall of thorax, initial encounter: Secondary | ICD-10-CM | POA: Diagnosis not present

## 2018-07-20 DIAGNOSIS — J449 Chronic obstructive pulmonary disease, unspecified: Secondary | ICD-10-CM | POA: Diagnosis not present

## 2018-07-20 DIAGNOSIS — Y92012 Bathroom of single-family (private) house as the place of occurrence of the external cause: Secondary | ICD-10-CM | POA: Diagnosis not present

## 2018-07-20 DIAGNOSIS — Z9181 History of falling: Secondary | ICD-10-CM | POA: Diagnosis not present

## 2018-07-20 DIAGNOSIS — W1830XA Fall on same level, unspecified, initial encounter: Secondary | ICD-10-CM | POA: Diagnosis not present

## 2018-07-20 DIAGNOSIS — Z743 Need for continuous supervision: Secondary | ICD-10-CM | POA: Diagnosis not present

## 2018-07-20 DIAGNOSIS — S20221A Contusion of right back wall of thorax, initial encounter: Secondary | ICD-10-CM | POA: Diagnosis not present

## 2018-07-20 DIAGNOSIS — K219 Gastro-esophageal reflux disease without esophagitis: Secondary | ICD-10-CM | POA: Diagnosis not present

## 2018-07-20 DIAGNOSIS — Z7401 Bed confinement status: Secondary | ICD-10-CM | POA: Diagnosis not present

## 2018-07-20 DIAGNOSIS — I4891 Unspecified atrial fibrillation: Secondary | ICD-10-CM | POA: Diagnosis not present

## 2018-07-20 DIAGNOSIS — L98419 Non-pressure chronic ulcer of buttock with unspecified severity: Secondary | ICD-10-CM | POA: Diagnosis not present

## 2018-07-20 DIAGNOSIS — Z7189 Other specified counseling: Secondary | ICD-10-CM | POA: Diagnosis not present

## 2018-07-20 DIAGNOSIS — S300XXA Contusion of lower back and pelvis, initial encounter: Secondary | ICD-10-CM | POA: Diagnosis not present

## 2018-07-20 DIAGNOSIS — I10 Essential (primary) hypertension: Secondary | ICD-10-CM | POA: Diagnosis not present

## 2018-07-20 DIAGNOSIS — Z87311 Personal history of (healed) other pathological fracture: Secondary | ICD-10-CM | POA: Diagnosis not present

## 2018-07-20 DIAGNOSIS — L98413 Non-pressure chronic ulcer of buttock with necrosis of muscle: Secondary | ICD-10-CM | POA: Diagnosis not present

## 2018-07-20 DIAGNOSIS — R52 Pain, unspecified: Secondary | ICD-10-CM | POA: Diagnosis not present

## 2018-07-20 DIAGNOSIS — S40211A Abrasion of right shoulder, initial encounter: Secondary | ICD-10-CM | POA: Diagnosis not present

## 2018-07-20 DIAGNOSIS — G8929 Other chronic pain: Secondary | ICD-10-CM | POA: Diagnosis not present

## 2018-07-20 DIAGNOSIS — L98429 Non-pressure chronic ulcer of back with unspecified severity: Secondary | ICD-10-CM | POA: Diagnosis not present

## 2018-07-20 DIAGNOSIS — Y939 Activity, unspecified: Secondary | ICD-10-CM | POA: Diagnosis not present

## 2018-07-21 DIAGNOSIS — L98413 Non-pressure chronic ulcer of buttock with necrosis of muscle: Secondary | ICD-10-CM | POA: Diagnosis not present

## 2018-07-21 DIAGNOSIS — Z7189 Other specified counseling: Secondary | ICD-10-CM | POA: Diagnosis not present

## 2018-07-21 DIAGNOSIS — I4891 Unspecified atrial fibrillation: Secondary | ICD-10-CM | POA: Diagnosis not present

## 2018-07-21 DIAGNOSIS — Z9181 History of falling: Secondary | ICD-10-CM | POA: Diagnosis not present

## 2018-07-21 DIAGNOSIS — K219 Gastro-esophageal reflux disease without esophagitis: Secondary | ICD-10-CM | POA: Diagnosis not present

## 2018-07-21 DIAGNOSIS — I10 Essential (primary) hypertension: Secondary | ICD-10-CM | POA: Diagnosis not present

## 2018-07-21 DIAGNOSIS — L98429 Non-pressure chronic ulcer of back with unspecified severity: Secondary | ICD-10-CM | POA: Diagnosis not present

## 2018-08-11 DIAGNOSIS — L98419 Non-pressure chronic ulcer of buttock with unspecified severity: Secondary | ICD-10-CM | POA: Diagnosis not present

## 2018-08-19 DIAGNOSIS — T79A21D Traumatic compartment syndrome of right lower extremity, subsequent encounter: Secondary | ICD-10-CM | POA: Diagnosis not present

## 2018-08-19 DIAGNOSIS — M3500 Sicca syndrome, unspecified: Secondary | ICD-10-CM | POA: Diagnosis not present

## 2018-08-19 DIAGNOSIS — J449 Chronic obstructive pulmonary disease, unspecified: Secondary | ICD-10-CM | POA: Diagnosis not present

## 2018-08-19 DIAGNOSIS — M810 Age-related osteoporosis without current pathological fracture: Secondary | ICD-10-CM | POA: Diagnosis not present

## 2018-08-19 DIAGNOSIS — L89891 Pressure ulcer of other site, stage 1: Secondary | ICD-10-CM | POA: Diagnosis not present

## 2018-08-19 DIAGNOSIS — Z7722 Contact with and (suspected) exposure to environmental tobacco smoke (acute) (chronic): Secondary | ICD-10-CM | POA: Diagnosis not present

## 2018-08-19 DIAGNOSIS — I482 Chronic atrial fibrillation, unspecified: Secondary | ICD-10-CM | POA: Diagnosis not present

## 2018-08-19 DIAGNOSIS — I4891 Unspecified atrial fibrillation: Secondary | ICD-10-CM | POA: Diagnosis not present

## 2018-08-19 DIAGNOSIS — Z9181 History of falling: Secondary | ICD-10-CM | POA: Diagnosis not present

## 2018-08-19 DIAGNOSIS — Z8781 Personal history of (healed) traumatic fracture: Secondary | ICD-10-CM | POA: Diagnosis not present

## 2018-08-19 DIAGNOSIS — I10 Essential (primary) hypertension: Secondary | ICD-10-CM | POA: Diagnosis not present

## 2018-08-19 DIAGNOSIS — M1991 Primary osteoarthritis, unspecified site: Secondary | ICD-10-CM | POA: Diagnosis not present

## 2018-08-19 DIAGNOSIS — K219 Gastro-esophageal reflux disease without esophagitis: Secondary | ICD-10-CM | POA: Diagnosis not present

## 2018-08-22 DIAGNOSIS — I10 Essential (primary) hypertension: Secondary | ICD-10-CM | POA: Diagnosis not present

## 2018-08-22 DIAGNOSIS — T79A21D Traumatic compartment syndrome of right lower extremity, subsequent encounter: Secondary | ICD-10-CM | POA: Diagnosis not present

## 2018-08-22 DIAGNOSIS — J449 Chronic obstructive pulmonary disease, unspecified: Secondary | ICD-10-CM | POA: Diagnosis not present

## 2018-08-22 DIAGNOSIS — I482 Chronic atrial fibrillation, unspecified: Secondary | ICD-10-CM | POA: Diagnosis not present

## 2018-08-22 DIAGNOSIS — K219 Gastro-esophageal reflux disease without esophagitis: Secondary | ICD-10-CM | POA: Diagnosis not present

## 2018-08-22 DIAGNOSIS — L89891 Pressure ulcer of other site, stage 1: Secondary | ICD-10-CM | POA: Diagnosis not present

## 2018-08-26 DIAGNOSIS — I889 Nonspecific lymphadenitis, unspecified: Secondary | ICD-10-CM | POA: Diagnosis not present

## 2018-08-26 DIAGNOSIS — F329 Major depressive disorder, single episode, unspecified: Secondary | ICD-10-CM | POA: Diagnosis not present

## 2018-08-31 DIAGNOSIS — L89891 Pressure ulcer of other site, stage 1: Secondary | ICD-10-CM | POA: Diagnosis not present

## 2018-08-31 DIAGNOSIS — K219 Gastro-esophageal reflux disease without esophagitis: Secondary | ICD-10-CM | POA: Diagnosis not present

## 2018-08-31 DIAGNOSIS — T79A21D Traumatic compartment syndrome of right lower extremity, subsequent encounter: Secondary | ICD-10-CM | POA: Diagnosis not present

## 2018-08-31 DIAGNOSIS — I482 Chronic atrial fibrillation, unspecified: Secondary | ICD-10-CM | POA: Diagnosis not present

## 2018-08-31 DIAGNOSIS — I10 Essential (primary) hypertension: Secondary | ICD-10-CM | POA: Diagnosis not present

## 2018-08-31 DIAGNOSIS — J449 Chronic obstructive pulmonary disease, unspecified: Secondary | ICD-10-CM | POA: Diagnosis not present

## 2018-09-06 DIAGNOSIS — L89891 Pressure ulcer of other site, stage 1: Secondary | ICD-10-CM | POA: Diagnosis not present

## 2018-09-06 DIAGNOSIS — K219 Gastro-esophageal reflux disease without esophagitis: Secondary | ICD-10-CM | POA: Diagnosis not present

## 2018-09-06 DIAGNOSIS — I482 Chronic atrial fibrillation, unspecified: Secondary | ICD-10-CM | POA: Diagnosis not present

## 2018-09-06 DIAGNOSIS — T79A21D Traumatic compartment syndrome of right lower extremity, subsequent encounter: Secondary | ICD-10-CM | POA: Diagnosis not present

## 2018-09-06 DIAGNOSIS — J449 Chronic obstructive pulmonary disease, unspecified: Secondary | ICD-10-CM | POA: Diagnosis not present

## 2018-09-06 DIAGNOSIS — I10 Essential (primary) hypertension: Secondary | ICD-10-CM | POA: Diagnosis not present

## 2018-09-13 DIAGNOSIS — I10 Essential (primary) hypertension: Secondary | ICD-10-CM | POA: Diagnosis not present

## 2018-09-13 DIAGNOSIS — I482 Chronic atrial fibrillation, unspecified: Secondary | ICD-10-CM | POA: Diagnosis not present

## 2018-09-13 DIAGNOSIS — T79A21D Traumatic compartment syndrome of right lower extremity, subsequent encounter: Secondary | ICD-10-CM | POA: Diagnosis not present

## 2018-09-13 DIAGNOSIS — J449 Chronic obstructive pulmonary disease, unspecified: Secondary | ICD-10-CM | POA: Diagnosis not present

## 2018-09-13 DIAGNOSIS — K219 Gastro-esophageal reflux disease without esophagitis: Secondary | ICD-10-CM | POA: Diagnosis not present

## 2018-09-13 DIAGNOSIS — L89891 Pressure ulcer of other site, stage 1: Secondary | ICD-10-CM | POA: Diagnosis not present

## 2018-09-18 DIAGNOSIS — Z8781 Personal history of (healed) traumatic fracture: Secondary | ICD-10-CM | POA: Diagnosis not present

## 2018-09-18 DIAGNOSIS — I10 Essential (primary) hypertension: Secondary | ICD-10-CM | POA: Diagnosis not present

## 2018-09-18 DIAGNOSIS — Z7722 Contact with and (suspected) exposure to environmental tobacco smoke (acute) (chronic): Secondary | ICD-10-CM | POA: Diagnosis not present

## 2018-09-18 DIAGNOSIS — L89891 Pressure ulcer of other site, stage 1: Secondary | ICD-10-CM | POA: Diagnosis not present

## 2018-09-18 DIAGNOSIS — M1991 Primary osteoarthritis, unspecified site: Secondary | ICD-10-CM | POA: Diagnosis not present

## 2018-09-18 DIAGNOSIS — M810 Age-related osteoporosis without current pathological fracture: Secondary | ICD-10-CM | POA: Diagnosis not present

## 2018-09-18 DIAGNOSIS — Z9181 History of falling: Secondary | ICD-10-CM | POA: Diagnosis not present

## 2018-09-18 DIAGNOSIS — K219 Gastro-esophageal reflux disease without esophagitis: Secondary | ICD-10-CM | POA: Diagnosis not present

## 2018-09-18 DIAGNOSIS — T79A21D Traumatic compartment syndrome of right lower extremity, subsequent encounter: Secondary | ICD-10-CM | POA: Diagnosis not present

## 2018-09-18 DIAGNOSIS — I482 Chronic atrial fibrillation, unspecified: Secondary | ICD-10-CM | POA: Diagnosis not present

## 2018-09-18 DIAGNOSIS — M3500 Sicca syndrome, unspecified: Secondary | ICD-10-CM | POA: Diagnosis not present

## 2018-09-18 DIAGNOSIS — J449 Chronic obstructive pulmonary disease, unspecified: Secondary | ICD-10-CM | POA: Diagnosis not present

## 2018-09-22 DIAGNOSIS — I10 Essential (primary) hypertension: Secondary | ICD-10-CM | POA: Diagnosis not present

## 2018-09-22 DIAGNOSIS — L89891 Pressure ulcer of other site, stage 1: Secondary | ICD-10-CM | POA: Diagnosis not present

## 2018-09-22 DIAGNOSIS — T79A21D Traumatic compartment syndrome of right lower extremity, subsequent encounter: Secondary | ICD-10-CM | POA: Diagnosis not present

## 2018-09-22 DIAGNOSIS — I482 Chronic atrial fibrillation, unspecified: Secondary | ICD-10-CM | POA: Diagnosis not present

## 2018-09-22 DIAGNOSIS — K219 Gastro-esophageal reflux disease without esophagitis: Secondary | ICD-10-CM | POA: Diagnosis not present

## 2018-09-22 DIAGNOSIS — J449 Chronic obstructive pulmonary disease, unspecified: Secondary | ICD-10-CM | POA: Diagnosis not present

## 2018-09-23 DIAGNOSIS — K5900 Constipation, unspecified: Secondary | ICD-10-CM | POA: Diagnosis not present

## 2018-09-23 DIAGNOSIS — B372 Candidiasis of skin and nail: Secondary | ICD-10-CM | POA: Diagnosis not present

## 2018-09-23 DIAGNOSIS — F418 Other specified anxiety disorders: Secondary | ICD-10-CM | POA: Diagnosis not present

## 2018-09-23 DIAGNOSIS — M1990 Unspecified osteoarthritis, unspecified site: Secondary | ICD-10-CM | POA: Diagnosis not present

## 2018-09-23 DIAGNOSIS — B351 Tinea unguium: Secondary | ICD-10-CM | POA: Diagnosis not present

## 2018-09-28 DIAGNOSIS — K219 Gastro-esophageal reflux disease without esophagitis: Secondary | ICD-10-CM | POA: Diagnosis not present

## 2018-09-28 DIAGNOSIS — I10 Essential (primary) hypertension: Secondary | ICD-10-CM | POA: Diagnosis not present

## 2018-09-28 DIAGNOSIS — J449 Chronic obstructive pulmonary disease, unspecified: Secondary | ICD-10-CM | POA: Diagnosis not present

## 2018-09-28 DIAGNOSIS — T79A21D Traumatic compartment syndrome of right lower extremity, subsequent encounter: Secondary | ICD-10-CM | POA: Diagnosis not present

## 2018-09-28 DIAGNOSIS — L89891 Pressure ulcer of other site, stage 1: Secondary | ICD-10-CM | POA: Diagnosis not present

## 2018-09-28 DIAGNOSIS — I482 Chronic atrial fibrillation, unspecified: Secondary | ICD-10-CM | POA: Diagnosis not present

## 2018-10-05 DIAGNOSIS — I482 Chronic atrial fibrillation, unspecified: Secondary | ICD-10-CM | POA: Diagnosis not present

## 2018-10-05 DIAGNOSIS — K219 Gastro-esophageal reflux disease without esophagitis: Secondary | ICD-10-CM | POA: Diagnosis not present

## 2018-10-05 DIAGNOSIS — J449 Chronic obstructive pulmonary disease, unspecified: Secondary | ICD-10-CM | POA: Diagnosis not present

## 2018-10-05 DIAGNOSIS — I10 Essential (primary) hypertension: Secondary | ICD-10-CM | POA: Diagnosis not present

## 2018-10-05 DIAGNOSIS — T79A21D Traumatic compartment syndrome of right lower extremity, subsequent encounter: Secondary | ICD-10-CM | POA: Diagnosis not present

## 2018-10-05 DIAGNOSIS — L89891 Pressure ulcer of other site, stage 1: Secondary | ICD-10-CM | POA: Diagnosis not present

## 2018-10-12 DIAGNOSIS — I482 Chronic atrial fibrillation, unspecified: Secondary | ICD-10-CM | POA: Diagnosis not present

## 2018-10-12 DIAGNOSIS — L89891 Pressure ulcer of other site, stage 1: Secondary | ICD-10-CM | POA: Diagnosis not present

## 2018-10-12 DIAGNOSIS — K219 Gastro-esophageal reflux disease without esophagitis: Secondary | ICD-10-CM | POA: Diagnosis not present

## 2018-10-12 DIAGNOSIS — I10 Essential (primary) hypertension: Secondary | ICD-10-CM | POA: Diagnosis not present

## 2018-10-12 DIAGNOSIS — T79A21D Traumatic compartment syndrome of right lower extremity, subsequent encounter: Secondary | ICD-10-CM | POA: Diagnosis not present

## 2018-10-12 DIAGNOSIS — J449 Chronic obstructive pulmonary disease, unspecified: Secondary | ICD-10-CM | POA: Diagnosis not present

## 2018-11-05 DIAGNOSIS — M1990 Unspecified osteoarthritis, unspecified site: Secondary | ICD-10-CM | POA: Diagnosis not present

## 2018-11-05 DIAGNOSIS — F411 Generalized anxiety disorder: Secondary | ICD-10-CM | POA: Diagnosis not present

## 2018-11-05 DIAGNOSIS — M542 Cervicalgia: Secondary | ICD-10-CM | POA: Diagnosis not present

## 2018-12-07 DIAGNOSIS — F411 Generalized anxiety disorder: Secondary | ICD-10-CM | POA: Diagnosis not present

## 2018-12-07 DIAGNOSIS — M1990 Unspecified osteoarthritis, unspecified site: Secondary | ICD-10-CM | POA: Diagnosis not present

## 2018-12-07 DIAGNOSIS — Z23 Encounter for immunization: Secondary | ICD-10-CM | POA: Diagnosis not present

## 2018-12-13 ENCOUNTER — Encounter: Payer: Medicare Other | Admitting: Internal Medicine

## 2018-12-20 ENCOUNTER — Encounter: Payer: Medicare Other | Admitting: Internal Medicine

## 2019-01-03 DIAGNOSIS — J029 Acute pharyngitis, unspecified: Secondary | ICD-10-CM | POA: Diagnosis not present

## 2019-01-03 DIAGNOSIS — M1990 Unspecified osteoarthritis, unspecified site: Secondary | ICD-10-CM | POA: Diagnosis not present

## 2019-01-03 DIAGNOSIS — Z760 Encounter for issue of repeat prescription: Secondary | ICD-10-CM | POA: Diagnosis not present

## 2019-04-07 DIAGNOSIS — H409 Unspecified glaucoma: Secondary | ICD-10-CM | POA: Diagnosis not present

## 2019-04-07 DIAGNOSIS — K5909 Other constipation: Secondary | ICD-10-CM | POA: Diagnosis not present

## 2019-04-07 DIAGNOSIS — M1990 Unspecified osteoarthritis, unspecified site: Secondary | ICD-10-CM | POA: Diagnosis not present

## 2019-04-07 DIAGNOSIS — M542 Cervicalgia: Secondary | ICD-10-CM | POA: Diagnosis not present

## 2019-04-20 DIAGNOSIS — Z7189 Other specified counseling: Secondary | ICD-10-CM | POA: Diagnosis not present

## 2019-04-20 DIAGNOSIS — R4189 Other symptoms and signs involving cognitive functions and awareness: Secondary | ICD-10-CM | POA: Diagnosis not present

## 2019-04-20 DIAGNOSIS — Z1211 Encounter for screening for malignant neoplasm of colon: Secondary | ICD-10-CM | POA: Diagnosis not present

## 2019-04-20 DIAGNOSIS — H547 Unspecified visual loss: Secondary | ICD-10-CM | POA: Diagnosis not present

## 2019-04-20 DIAGNOSIS — R2681 Unsteadiness on feet: Secondary | ICD-10-CM | POA: Diagnosis not present

## 2019-04-20 DIAGNOSIS — H9193 Unspecified hearing loss, bilateral: Secondary | ICD-10-CM | POA: Diagnosis not present

## 2019-04-20 DIAGNOSIS — Z1231 Encounter for screening mammogram for malignant neoplasm of breast: Secondary | ICD-10-CM | POA: Diagnosis not present

## 2019-04-20 DIAGNOSIS — Z23 Encounter for immunization: Secondary | ICD-10-CM | POA: Diagnosis not present

## 2019-04-20 DIAGNOSIS — Z0001 Encounter for general adult medical examination with abnormal findings: Secondary | ICD-10-CM | POA: Diagnosis not present

## 2019-04-20 DIAGNOSIS — Z13820 Encounter for screening for osteoporosis: Secondary | ICD-10-CM | POA: Diagnosis not present

## 2019-04-20 DIAGNOSIS — N390 Urinary tract infection, site not specified: Secondary | ICD-10-CM | POA: Diagnosis not present

## 2019-04-22 DIAGNOSIS — N390 Urinary tract infection, site not specified: Secondary | ICD-10-CM | POA: Diagnosis not present

## 2019-05-12 DIAGNOSIS — W19XXXA Unspecified fall, initial encounter: Secondary | ICD-10-CM | POA: Diagnosis not present

## 2019-05-12 DIAGNOSIS — R05 Cough: Secondary | ICD-10-CM | POA: Diagnosis not present

## 2019-05-22 DIAGNOSIS — Z23 Encounter for immunization: Secondary | ICD-10-CM | POA: Diagnosis not present

## 2019-05-30 DIAGNOSIS — T1490XA Injury, unspecified, initial encounter: Secondary | ICD-10-CM | POA: Diagnosis not present

## 2019-05-30 DIAGNOSIS — S72142A Displaced intertrochanteric fracture of left femur, initial encounter for closed fracture: Secondary | ICD-10-CM | POA: Diagnosis not present

## 2019-05-30 DIAGNOSIS — Z743 Need for continuous supervision: Secondary | ICD-10-CM | POA: Diagnosis not present

## 2019-05-30 DIAGNOSIS — I5032 Chronic diastolic (congestive) heart failure: Secondary | ICD-10-CM | POA: Diagnosis not present

## 2019-05-30 DIAGNOSIS — Z681 Body mass index (BMI) 19 or less, adult: Secondary | ICD-10-CM | POA: Diagnosis not present

## 2019-05-30 DIAGNOSIS — M25559 Pain in unspecified hip: Secondary | ICD-10-CM | POA: Diagnosis not present

## 2019-05-30 DIAGNOSIS — S72102A Unspecified trochanteric fracture of left femur, initial encounter for closed fracture: Secondary | ICD-10-CM | POA: Diagnosis not present

## 2019-05-30 DIAGNOSIS — M25552 Pain in left hip: Secondary | ICD-10-CM | POA: Diagnosis not present

## 2019-05-30 DIAGNOSIS — D62 Acute posthemorrhagic anemia: Secondary | ICD-10-CM | POA: Diagnosis not present

## 2019-05-30 DIAGNOSIS — W19XXXA Unspecified fall, initial encounter: Secondary | ICD-10-CM | POA: Diagnosis not present

## 2019-05-30 DIAGNOSIS — E43 Unspecified severe protein-calorie malnutrition: Secondary | ICD-10-CM | POA: Diagnosis not present

## 2019-05-30 DIAGNOSIS — Z1152 Encounter for screening for COVID-19: Secondary | ICD-10-CM | POA: Diagnosis not present

## 2019-05-30 DIAGNOSIS — R64 Cachexia: Secondary | ICD-10-CM | POA: Diagnosis not present

## 2019-05-31 DIAGNOSIS — K224 Dyskinesia of esophagus: Secondary | ICD-10-CM | POA: Diagnosis not present

## 2019-05-31 DIAGNOSIS — M6281 Muscle weakness (generalized): Secondary | ICD-10-CM | POA: Diagnosis not present

## 2019-05-31 DIAGNOSIS — Z9981 Dependence on supplemental oxygen: Secondary | ICD-10-CM | POA: Diagnosis not present

## 2019-05-31 DIAGNOSIS — I4891 Unspecified atrial fibrillation: Secondary | ICD-10-CM | POA: Diagnosis present

## 2019-05-31 DIAGNOSIS — K222 Esophageal obstruction: Secondary | ICD-10-CM | POA: Diagnosis not present

## 2019-05-31 DIAGNOSIS — S7222XA Displaced subtrochanteric fracture of left femur, initial encounter for closed fracture: Secondary | ICD-10-CM | POA: Diagnosis not present

## 2019-05-31 DIAGNOSIS — I5032 Chronic diastolic (congestive) heart failure: Secondary | ICD-10-CM | POA: Diagnosis present

## 2019-05-31 DIAGNOSIS — S72102A Unspecified trochanteric fracture of left femur, initial encounter for closed fracture: Secondary | ICD-10-CM | POA: Diagnosis present

## 2019-05-31 DIAGNOSIS — I482 Chronic atrial fibrillation, unspecified: Secondary | ICD-10-CM | POA: Diagnosis not present

## 2019-05-31 DIAGNOSIS — M25552 Pain in left hip: Secondary | ICD-10-CM | POA: Diagnosis not present

## 2019-05-31 DIAGNOSIS — T1490XA Injury, unspecified, initial encounter: Secondary | ICD-10-CM | POA: Diagnosis not present

## 2019-05-31 DIAGNOSIS — F329 Major depressive disorder, single episode, unspecified: Secondary | ICD-10-CM | POA: Diagnosis present

## 2019-05-31 DIAGNOSIS — H409 Unspecified glaucoma: Secondary | ICD-10-CM | POA: Diagnosis not present

## 2019-05-31 DIAGNOSIS — E876 Hypokalemia: Secondary | ICD-10-CM | POA: Diagnosis not present

## 2019-05-31 DIAGNOSIS — Y92012 Bathroom of single-family (private) house as the place of occurrence of the external cause: Secondary | ICD-10-CM | POA: Diagnosis not present

## 2019-05-31 DIAGNOSIS — S72002A Fracture of unspecified part of neck of left femur, initial encounter for closed fracture: Secondary | ICD-10-CM | POA: Diagnosis not present

## 2019-05-31 DIAGNOSIS — R64 Cachexia: Secondary | ICD-10-CM | POA: Diagnosis present

## 2019-05-31 DIAGNOSIS — R1314 Dysphagia, pharyngoesophageal phase: Secondary | ICD-10-CM | POA: Diagnosis not present

## 2019-05-31 DIAGNOSIS — J449 Chronic obstructive pulmonary disease, unspecified: Secondary | ICD-10-CM | POA: Diagnosis not present

## 2019-05-31 DIAGNOSIS — S72142A Displaced intertrochanteric fracture of left femur, initial encounter for closed fracture: Secondary | ICD-10-CM | POA: Diagnosis not present

## 2019-05-31 DIAGNOSIS — S72145A Nondisplaced intertrochanteric fracture of left femur, initial encounter for closed fracture: Secondary | ICD-10-CM | POA: Diagnosis not present

## 2019-05-31 DIAGNOSIS — S72142D Displaced intertrochanteric fracture of left femur, subsequent encounter for closed fracture with routine healing: Secondary | ICD-10-CM | POA: Diagnosis not present

## 2019-05-31 DIAGNOSIS — M3500 Sicca syndrome, unspecified: Secondary | ICD-10-CM | POA: Diagnosis present

## 2019-05-31 DIAGNOSIS — R279 Unspecified lack of coordination: Secondary | ICD-10-CM | POA: Diagnosis not present

## 2019-05-31 DIAGNOSIS — R339 Retention of urine, unspecified: Secondary | ICD-10-CM | POA: Diagnosis not present

## 2019-05-31 DIAGNOSIS — M1990 Unspecified osteoarthritis, unspecified site: Secondary | ICD-10-CM | POA: Diagnosis present

## 2019-05-31 DIAGNOSIS — J4520 Mild intermittent asthma, uncomplicated: Secondary | ICD-10-CM | POA: Diagnosis present

## 2019-05-31 DIAGNOSIS — I110 Hypertensive heart disease with heart failure: Secondary | ICD-10-CM | POA: Diagnosis not present

## 2019-05-31 DIAGNOSIS — J439 Emphysema, unspecified: Secondary | ICD-10-CM | POA: Diagnosis present

## 2019-05-31 DIAGNOSIS — Z7951 Long term (current) use of inhaled steroids: Secondary | ICD-10-CM | POA: Diagnosis not present

## 2019-05-31 DIAGNOSIS — Z7401 Bed confinement status: Secondary | ICD-10-CM | POA: Diagnosis not present

## 2019-05-31 DIAGNOSIS — I509 Heart failure, unspecified: Secondary | ICD-10-CM | POA: Diagnosis not present

## 2019-05-31 DIAGNOSIS — Z4789 Encounter for other orthopedic aftercare: Secondary | ICD-10-CM | POA: Diagnosis not present

## 2019-05-31 DIAGNOSIS — Z7901 Long term (current) use of anticoagulants: Secondary | ICD-10-CM | POA: Diagnosis not present

## 2019-05-31 DIAGNOSIS — M810 Age-related osteoporosis without current pathological fracture: Secondary | ICD-10-CM | POA: Diagnosis present

## 2019-05-31 DIAGNOSIS — Z9181 History of falling: Secondary | ICD-10-CM | POA: Diagnosis not present

## 2019-05-31 DIAGNOSIS — J45909 Unspecified asthma, uncomplicated: Secondary | ICD-10-CM | POA: Diagnosis not present

## 2019-05-31 DIAGNOSIS — W19XXXA Unspecified fall, initial encounter: Secondary | ICD-10-CM | POA: Diagnosis not present

## 2019-05-31 DIAGNOSIS — R296 Repeated falls: Secondary | ICD-10-CM | POA: Diagnosis not present

## 2019-05-31 DIAGNOSIS — F419 Anxiety disorder, unspecified: Secondary | ICD-10-CM | POA: Diagnosis present

## 2019-05-31 DIAGNOSIS — W010XXA Fall on same level from slipping, tripping and stumbling without subsequent striking against object, initial encounter: Secondary | ICD-10-CM | POA: Diagnosis not present

## 2019-05-31 DIAGNOSIS — R1310 Dysphagia, unspecified: Secondary | ICD-10-CM | POA: Diagnosis not present

## 2019-05-31 DIAGNOSIS — Z681 Body mass index (BMI) 19 or less, adult: Secondary | ICD-10-CM | POA: Diagnosis not present

## 2019-05-31 DIAGNOSIS — E43 Unspecified severe protein-calorie malnutrition: Secondary | ICD-10-CM | POA: Diagnosis present

## 2019-05-31 DIAGNOSIS — D62 Acute posthemorrhagic anemia: Secondary | ICD-10-CM | POA: Diagnosis not present

## 2019-05-31 DIAGNOSIS — R269 Unspecified abnormalities of gait and mobility: Secondary | ICD-10-CM | POA: Diagnosis not present

## 2019-06-09 DIAGNOSIS — F339 Major depressive disorder, recurrent, unspecified: Secondary | ICD-10-CM | POA: Diagnosis not present

## 2019-06-09 DIAGNOSIS — I509 Heart failure, unspecified: Secondary | ICD-10-CM | POA: Diagnosis not present

## 2019-06-09 DIAGNOSIS — I4891 Unspecified atrial fibrillation: Secondary | ICD-10-CM | POA: Diagnosis not present

## 2019-06-09 DIAGNOSIS — S72142A Displaced intertrochanteric fracture of left femur, initial encounter for closed fracture: Secondary | ICD-10-CM | POA: Diagnosis not present

## 2019-06-09 DIAGNOSIS — I482 Chronic atrial fibrillation, unspecified: Secondary | ICD-10-CM | POA: Diagnosis not present

## 2019-06-09 DIAGNOSIS — W19XXXA Unspecified fall, initial encounter: Secondary | ICD-10-CM | POA: Diagnosis not present

## 2019-06-09 DIAGNOSIS — D649 Anemia, unspecified: Secondary | ICD-10-CM | POA: Diagnosis not present

## 2019-06-09 DIAGNOSIS — Z9981 Dependence on supplemental oxygen: Secondary | ICD-10-CM | POA: Diagnosis not present

## 2019-06-09 DIAGNOSIS — J449 Chronic obstructive pulmonary disease, unspecified: Secondary | ICD-10-CM | POA: Diagnosis not present

## 2019-06-09 DIAGNOSIS — M25552 Pain in left hip: Secondary | ICD-10-CM | POA: Diagnosis not present

## 2019-06-09 DIAGNOSIS — M6281 Muscle weakness (generalized): Secondary | ICD-10-CM | POA: Diagnosis not present

## 2019-06-09 DIAGNOSIS — M898X5 Other specified disorders of bone, thigh: Secondary | ICD-10-CM | POA: Diagnosis not present

## 2019-06-09 DIAGNOSIS — F329 Major depressive disorder, single episode, unspecified: Secondary | ICD-10-CM | POA: Diagnosis not present

## 2019-06-09 DIAGNOSIS — J45909 Unspecified asthma, uncomplicated: Secondary | ICD-10-CM | POA: Diagnosis not present

## 2019-06-09 DIAGNOSIS — K222 Esophageal obstruction: Secondary | ICD-10-CM | POA: Diagnosis not present

## 2019-06-09 DIAGNOSIS — Z7901 Long term (current) use of anticoagulants: Secondary | ICD-10-CM | POA: Diagnosis not present

## 2019-06-09 DIAGNOSIS — R279 Unspecified lack of coordination: Secondary | ICD-10-CM | POA: Diagnosis not present

## 2019-06-09 DIAGNOSIS — R1314 Dysphagia, pharyngoesophageal phase: Secondary | ICD-10-CM | POA: Diagnosis not present

## 2019-06-09 DIAGNOSIS — R269 Unspecified abnormalities of gait and mobility: Secondary | ICD-10-CM | POA: Diagnosis not present

## 2019-06-09 DIAGNOSIS — Z7401 Bed confinement status: Secondary | ICD-10-CM | POA: Diagnosis not present

## 2019-06-09 DIAGNOSIS — S72142D Displaced intertrochanteric fracture of left femur, subsequent encounter for closed fracture with routine healing: Secondary | ICD-10-CM | POA: Diagnosis not present

## 2019-06-09 DIAGNOSIS — R634 Abnormal weight loss: Secondary | ICD-10-CM | POA: Diagnosis not present

## 2019-06-09 DIAGNOSIS — Z4789 Encounter for other orthopedic aftercare: Secondary | ICD-10-CM | POA: Diagnosis not present

## 2019-06-09 DIAGNOSIS — L89312 Pressure ulcer of right buttock, stage 2: Secondary | ICD-10-CM | POA: Diagnosis not present

## 2019-06-09 DIAGNOSIS — S72145A Nondisplaced intertrochanteric fracture of left femur, initial encounter for closed fracture: Secondary | ICD-10-CM | POA: Diagnosis not present

## 2019-06-09 DIAGNOSIS — D62 Acute posthemorrhagic anemia: Secondary | ICD-10-CM | POA: Diagnosis not present

## 2019-06-09 DIAGNOSIS — M3500 Sicca syndrome, unspecified: Secondary | ICD-10-CM | POA: Diagnosis not present

## 2019-06-09 DIAGNOSIS — E43 Unspecified severe protein-calorie malnutrition: Secondary | ICD-10-CM | POA: Diagnosis not present

## 2019-06-09 DIAGNOSIS — K224 Dyskinesia of esophagus: Secondary | ICD-10-CM | POA: Diagnosis not present

## 2019-06-09 DIAGNOSIS — R296 Repeated falls: Secondary | ICD-10-CM | POA: Diagnosis not present

## 2019-06-09 DIAGNOSIS — R1310 Dysphagia, unspecified: Secondary | ICD-10-CM | POA: Diagnosis not present

## 2019-06-14 DIAGNOSIS — I4891 Unspecified atrial fibrillation: Secondary | ICD-10-CM | POA: Diagnosis not present

## 2019-06-14 DIAGNOSIS — I509 Heart failure, unspecified: Secondary | ICD-10-CM | POA: Diagnosis not present

## 2019-06-14 DIAGNOSIS — S72142A Displaced intertrochanteric fracture of left femur, initial encounter for closed fracture: Secondary | ICD-10-CM | POA: Diagnosis not present

## 2019-06-15 DIAGNOSIS — L89312 Pressure ulcer of right buttock, stage 2: Secondary | ICD-10-CM | POA: Diagnosis not present

## 2019-06-22 DIAGNOSIS — L89312 Pressure ulcer of right buttock, stage 2: Secondary | ICD-10-CM | POA: Diagnosis not present

## 2019-06-23 DIAGNOSIS — Z4789 Encounter for other orthopedic aftercare: Secondary | ICD-10-CM | POA: Diagnosis not present

## 2019-06-23 DIAGNOSIS — S72142A Displaced intertrochanteric fracture of left femur, initial encounter for closed fracture: Secondary | ICD-10-CM | POA: Diagnosis not present

## 2019-06-23 DIAGNOSIS — M898X5 Other specified disorders of bone, thigh: Secondary | ICD-10-CM | POA: Diagnosis not present

## 2019-06-28 DIAGNOSIS — R634 Abnormal weight loss: Secondary | ICD-10-CM | POA: Diagnosis not present

## 2019-06-28 DIAGNOSIS — F329 Major depressive disorder, single episode, unspecified: Secondary | ICD-10-CM | POA: Diagnosis not present

## 2019-06-30 DIAGNOSIS — L89312 Pressure ulcer of right buttock, stage 2: Secondary | ICD-10-CM | POA: Diagnosis not present

## 2019-07-26 DIAGNOSIS — F329 Major depressive disorder, single episode, unspecified: Secondary | ICD-10-CM | POA: Diagnosis not present

## 2019-07-28 DIAGNOSIS — I4891 Unspecified atrial fibrillation: Secondary | ICD-10-CM | POA: Diagnosis not present

## 2019-07-28 DIAGNOSIS — D649 Anemia, unspecified: Secondary | ICD-10-CM | POA: Diagnosis not present

## 2019-07-28 DIAGNOSIS — R1310 Dysphagia, unspecified: Secondary | ICD-10-CM | POA: Diagnosis not present

## 2019-08-02 DIAGNOSIS — S72009A Fracture of unspecified part of neck of unspecified femur, initial encounter for closed fracture: Secondary | ICD-10-CM | POA: Diagnosis not present

## 2019-08-02 DIAGNOSIS — J449 Chronic obstructive pulmonary disease, unspecified: Secondary | ICD-10-CM | POA: Diagnosis not present

## 2019-08-02 DIAGNOSIS — I4891 Unspecified atrial fibrillation: Secondary | ICD-10-CM | POA: Diagnosis not present

## 2019-08-09 DIAGNOSIS — J449 Chronic obstructive pulmonary disease, unspecified: Secondary | ICD-10-CM | POA: Diagnosis not present

## 2019-08-09 DIAGNOSIS — I4891 Unspecified atrial fibrillation: Secondary | ICD-10-CM | POA: Diagnosis not present

## 2019-08-09 DIAGNOSIS — F329 Major depressive disorder, single episode, unspecified: Secondary | ICD-10-CM | POA: Diagnosis not present

## 2019-08-11 DIAGNOSIS — I4891 Unspecified atrial fibrillation: Secondary | ICD-10-CM | POA: Diagnosis not present

## 2019-08-11 DIAGNOSIS — J4520 Mild intermittent asthma, uncomplicated: Secondary | ICD-10-CM | POA: Diagnosis not present

## 2019-08-11 DIAGNOSIS — S72009A Fracture of unspecified part of neck of unspecified femur, initial encounter for closed fracture: Secondary | ICD-10-CM | POA: Diagnosis not present

## 2019-08-12 DIAGNOSIS — F339 Major depressive disorder, recurrent, unspecified: Secondary | ICD-10-CM | POA: Diagnosis not present

## 2019-08-12 DIAGNOSIS — E43 Unspecified severe protein-calorie malnutrition: Secondary | ICD-10-CM | POA: Diagnosis not present

## 2019-08-12 DIAGNOSIS — D509 Iron deficiency anemia, unspecified: Secondary | ICD-10-CM | POA: Diagnosis not present

## 2019-08-12 DIAGNOSIS — J449 Chronic obstructive pulmonary disease, unspecified: Secondary | ICD-10-CM | POA: Diagnosis not present

## 2019-08-12 DIAGNOSIS — E876 Hypokalemia: Secondary | ICD-10-CM | POA: Diagnosis not present

## 2019-08-12 DIAGNOSIS — I509 Heart failure, unspecified: Secondary | ICD-10-CM | POA: Diagnosis not present

## 2019-08-12 DIAGNOSIS — I4891 Unspecified atrial fibrillation: Secondary | ICD-10-CM | POA: Diagnosis not present

## 2019-08-14 DIAGNOSIS — Z1159 Encounter for screening for other viral diseases: Secondary | ICD-10-CM | POA: Diagnosis not present

## 2019-08-16 DIAGNOSIS — I509 Heart failure, unspecified: Secondary | ICD-10-CM | POA: Diagnosis not present

## 2019-08-16 DIAGNOSIS — J449 Chronic obstructive pulmonary disease, unspecified: Secondary | ICD-10-CM | POA: Diagnosis not present

## 2019-08-16 DIAGNOSIS — I4891 Unspecified atrial fibrillation: Secondary | ICD-10-CM | POA: Diagnosis not present

## 2019-08-18 DIAGNOSIS — J449 Chronic obstructive pulmonary disease, unspecified: Secondary | ICD-10-CM | POA: Diagnosis not present

## 2019-08-18 DIAGNOSIS — I509 Heart failure, unspecified: Secondary | ICD-10-CM | POA: Diagnosis not present

## 2019-08-18 DIAGNOSIS — I4891 Unspecified atrial fibrillation: Secondary | ICD-10-CM | POA: Diagnosis not present

## 2019-08-22 DIAGNOSIS — Z20828 Contact with and (suspected) exposure to other viral communicable diseases: Secondary | ICD-10-CM | POA: Diagnosis not present

## 2019-09-01 DIAGNOSIS — J4520 Mild intermittent asthma, uncomplicated: Secondary | ICD-10-CM | POA: Diagnosis not present

## 2019-09-01 DIAGNOSIS — J449 Chronic obstructive pulmonary disease, unspecified: Secondary | ICD-10-CM | POA: Diagnosis not present

## 2019-09-01 DIAGNOSIS — I4891 Unspecified atrial fibrillation: Secondary | ICD-10-CM | POA: Diagnosis not present

## 2019-09-06 DIAGNOSIS — D649 Anemia, unspecified: Secondary | ICD-10-CM | POA: Diagnosis not present

## 2019-09-06 DIAGNOSIS — J449 Chronic obstructive pulmonary disease, unspecified: Secondary | ICD-10-CM | POA: Diagnosis not present

## 2019-09-06 DIAGNOSIS — J4520 Mild intermittent asthma, uncomplicated: Secondary | ICD-10-CM | POA: Diagnosis not present

## 2019-09-08 DIAGNOSIS — I509 Heart failure, unspecified: Secondary | ICD-10-CM | POA: Diagnosis not present

## 2019-09-08 DIAGNOSIS — N19 Unspecified kidney failure: Secondary | ICD-10-CM | POA: Diagnosis not present

## 2019-09-08 DIAGNOSIS — I4891 Unspecified atrial fibrillation: Secondary | ICD-10-CM | POA: Diagnosis not present

## 2019-09-20 DIAGNOSIS — J449 Chronic obstructive pulmonary disease, unspecified: Secondary | ICD-10-CM | POA: Diagnosis not present

## 2019-09-20 DIAGNOSIS — I509 Heart failure, unspecified: Secondary | ICD-10-CM | POA: Diagnosis not present

## 2019-09-20 DIAGNOSIS — D649 Anemia, unspecified: Secondary | ICD-10-CM | POA: Diagnosis not present

## 2019-09-22 DIAGNOSIS — I4891 Unspecified atrial fibrillation: Secondary | ICD-10-CM | POA: Diagnosis not present

## 2019-09-22 DIAGNOSIS — J449 Chronic obstructive pulmonary disease, unspecified: Secondary | ICD-10-CM | POA: Diagnosis not present

## 2019-09-22 DIAGNOSIS — I509 Heart failure, unspecified: Secondary | ICD-10-CM | POA: Diagnosis not present

## 2019-09-29 DIAGNOSIS — I4891 Unspecified atrial fibrillation: Secondary | ICD-10-CM | POA: Diagnosis not present

## 2019-09-29 DIAGNOSIS — N19 Unspecified kidney failure: Secondary | ICD-10-CM | POA: Diagnosis not present

## 2019-09-29 DIAGNOSIS — I509 Heart failure, unspecified: Secondary | ICD-10-CM | POA: Diagnosis not present

## 2019-10-06 DIAGNOSIS — D649 Anemia, unspecified: Secondary | ICD-10-CM | POA: Diagnosis not present

## 2019-10-06 DIAGNOSIS — I4891 Unspecified atrial fibrillation: Secondary | ICD-10-CM | POA: Diagnosis not present

## 2019-10-06 DIAGNOSIS — J449 Chronic obstructive pulmonary disease, unspecified: Secondary | ICD-10-CM | POA: Diagnosis not present

## 2019-10-13 DIAGNOSIS — I70293 Other atherosclerosis of native arteries of extremities, bilateral legs: Secondary | ICD-10-CM | POA: Diagnosis not present

## 2019-10-13 DIAGNOSIS — B351 Tinea unguium: Secondary | ICD-10-CM | POA: Diagnosis not present

## 2019-10-24 DIAGNOSIS — Z0189 Encounter for other specified special examinations: Secondary | ICD-10-CM | POA: Diagnosis not present

## 2019-10-25 DIAGNOSIS — I509 Heart failure, unspecified: Secondary | ICD-10-CM | POA: Diagnosis not present

## 2019-10-25 DIAGNOSIS — J449 Chronic obstructive pulmonary disease, unspecified: Secondary | ICD-10-CM | POA: Diagnosis not present

## 2019-10-25 DIAGNOSIS — M3500 Sicca syndrome, unspecified: Secondary | ICD-10-CM | POA: Diagnosis not present

## 2019-10-25 DIAGNOSIS — I4891 Unspecified atrial fibrillation: Secondary | ICD-10-CM | POA: Diagnosis not present

## 2019-10-27 DIAGNOSIS — D649 Anemia, unspecified: Secondary | ICD-10-CM | POA: Diagnosis not present

## 2019-10-27 DIAGNOSIS — I509 Heart failure, unspecified: Secondary | ICD-10-CM | POA: Diagnosis not present

## 2019-10-27 DIAGNOSIS — J449 Chronic obstructive pulmonary disease, unspecified: Secondary | ICD-10-CM | POA: Diagnosis not present

## 2019-10-31 DIAGNOSIS — R1312 Dysphagia, oropharyngeal phase: Secondary | ICD-10-CM | POA: Diagnosis not present

## 2019-10-31 DIAGNOSIS — N19 Unspecified kidney failure: Secondary | ICD-10-CM | POA: Diagnosis not present

## 2019-10-31 DIAGNOSIS — R293 Abnormal posture: Secondary | ICD-10-CM | POA: Diagnosis not present

## 2019-10-31 DIAGNOSIS — M3500 Sicca syndrome, unspecified: Secondary | ICD-10-CM | POA: Diagnosis not present

## 2019-10-31 DIAGNOSIS — Z13228 Encounter for screening for other metabolic disorders: Secondary | ICD-10-CM | POA: Diagnosis not present

## 2019-10-31 DIAGNOSIS — J449 Chronic obstructive pulmonary disease, unspecified: Secondary | ICD-10-CM | POA: Diagnosis not present

## 2019-10-31 DIAGNOSIS — I4891 Unspecified atrial fibrillation: Secondary | ICD-10-CM | POA: Diagnosis not present

## 2019-10-31 DIAGNOSIS — F329 Major depressive disorder, single episode, unspecified: Secondary | ICD-10-CM | POA: Diagnosis not present

## 2019-10-31 DIAGNOSIS — D649 Anemia, unspecified: Secondary | ICD-10-CM | POA: Diagnosis not present

## 2019-10-31 DIAGNOSIS — Z0189 Encounter for other specified special examinations: Secondary | ICD-10-CM | POA: Diagnosis not present

## 2019-10-31 DIAGNOSIS — J9610 Chronic respiratory failure, unspecified whether with hypoxia or hypercapnia: Secondary | ICD-10-CM | POA: Diagnosis not present

## 2019-10-31 DIAGNOSIS — J9620 Acute and chronic respiratory failure, unspecified whether with hypoxia or hypercapnia: Secondary | ICD-10-CM | POA: Diagnosis not present

## 2019-10-31 DIAGNOSIS — K224 Dyskinesia of esophagus: Secondary | ICD-10-CM | POA: Diagnosis not present

## 2019-10-31 DIAGNOSIS — H409 Unspecified glaucoma: Secondary | ICD-10-CM | POA: Diagnosis not present

## 2019-10-31 DIAGNOSIS — K222 Esophageal obstruction: Secondary | ICD-10-CM | POA: Diagnosis not present

## 2019-10-31 DIAGNOSIS — R6889 Other general symptoms and signs: Secondary | ICD-10-CM | POA: Diagnosis not present

## 2019-10-31 DIAGNOSIS — I509 Heart failure, unspecified: Secondary | ICD-10-CM | POA: Diagnosis not present

## 2019-10-31 DIAGNOSIS — E43 Unspecified severe protein-calorie malnutrition: Secondary | ICD-10-CM | POA: Diagnosis not present

## 2019-10-31 DIAGNOSIS — I2510 Atherosclerotic heart disease of native coronary artery without angina pectoris: Secondary | ICD-10-CM | POA: Diagnosis not present

## 2019-11-01 DIAGNOSIS — I2510 Atherosclerotic heart disease of native coronary artery without angina pectoris: Secondary | ICD-10-CM | POA: Diagnosis not present

## 2019-11-01 DIAGNOSIS — J449 Chronic obstructive pulmonary disease, unspecified: Secondary | ICD-10-CM | POA: Diagnosis not present

## 2019-11-01 DIAGNOSIS — N19 Unspecified kidney failure: Secondary | ICD-10-CM | POA: Diagnosis not present

## 2019-11-03 DIAGNOSIS — I4891 Unspecified atrial fibrillation: Secondary | ICD-10-CM | POA: Diagnosis not present

## 2019-11-03 DIAGNOSIS — N19 Unspecified kidney failure: Secondary | ICD-10-CM | POA: Diagnosis not present

## 2019-11-08 DIAGNOSIS — I509 Heart failure, unspecified: Secondary | ICD-10-CM | POA: Diagnosis not present

## 2019-11-08 DIAGNOSIS — J9610 Chronic respiratory failure, unspecified whether with hypoxia or hypercapnia: Secondary | ICD-10-CM | POA: Diagnosis not present

## 2019-11-08 DIAGNOSIS — J449 Chronic obstructive pulmonary disease, unspecified: Secondary | ICD-10-CM | POA: Diagnosis not present

## 2019-11-10 DIAGNOSIS — J449 Chronic obstructive pulmonary disease, unspecified: Secondary | ICD-10-CM | POA: Diagnosis not present

## 2019-11-10 DIAGNOSIS — J9620 Acute and chronic respiratory failure, unspecified whether with hypoxia or hypercapnia: Secondary | ICD-10-CM | POA: Diagnosis not present

## 2019-11-10 DIAGNOSIS — D649 Anemia, unspecified: Secondary | ICD-10-CM | POA: Diagnosis not present

## 2019-11-17 DIAGNOSIS — F329 Major depressive disorder, single episode, unspecified: Secondary | ICD-10-CM | POA: Diagnosis not present

## 2019-11-17 DIAGNOSIS — J449 Chronic obstructive pulmonary disease, unspecified: Secondary | ICD-10-CM | POA: Diagnosis not present

## 2019-11-17 DIAGNOSIS — I509 Heart failure, unspecified: Secondary | ICD-10-CM | POA: Diagnosis not present

## 2019-11-22 DIAGNOSIS — J449 Chronic obstructive pulmonary disease, unspecified: Secondary | ICD-10-CM | POA: Diagnosis not present

## 2019-11-22 DIAGNOSIS — D649 Anemia, unspecified: Secondary | ICD-10-CM | POA: Diagnosis not present

## 2019-11-22 DIAGNOSIS — N19 Unspecified kidney failure: Secondary | ICD-10-CM | POA: Diagnosis not present

## 2019-11-24 DIAGNOSIS — I4891 Unspecified atrial fibrillation: Secondary | ICD-10-CM | POA: Diagnosis not present

## 2019-11-24 DIAGNOSIS — J449 Chronic obstructive pulmonary disease, unspecified: Secondary | ICD-10-CM | POA: Diagnosis not present

## 2019-11-24 DIAGNOSIS — I509 Heart failure, unspecified: Secondary | ICD-10-CM | POA: Diagnosis not present

## 2019-11-29 DIAGNOSIS — D649 Anemia, unspecified: Secondary | ICD-10-CM | POA: Diagnosis not present

## 2019-11-29 DIAGNOSIS — F329 Major depressive disorder, single episode, unspecified: Secondary | ICD-10-CM | POA: Diagnosis not present

## 2019-11-29 DIAGNOSIS — J449 Chronic obstructive pulmonary disease, unspecified: Secondary | ICD-10-CM | POA: Diagnosis not present

## 2019-12-02 DIAGNOSIS — H401133 Primary open-angle glaucoma, bilateral, severe stage: Secondary | ICD-10-CM | POA: Diagnosis not present

## 2019-12-02 DIAGNOSIS — Z961 Presence of intraocular lens: Secondary | ICD-10-CM | POA: Diagnosis not present

## 2019-12-06 DIAGNOSIS — F329 Major depressive disorder, single episode, unspecified: Secondary | ICD-10-CM | POA: Diagnosis not present

## 2019-12-06 DIAGNOSIS — I4891 Unspecified atrial fibrillation: Secondary | ICD-10-CM | POA: Diagnosis not present

## 2019-12-06 DIAGNOSIS — I509 Heart failure, unspecified: Secondary | ICD-10-CM | POA: Diagnosis not present

## 2019-12-14 DIAGNOSIS — Z23 Encounter for immunization: Secondary | ICD-10-CM | POA: Diagnosis not present

## 2019-12-20 DIAGNOSIS — I4891 Unspecified atrial fibrillation: Secondary | ICD-10-CM | POA: Diagnosis not present

## 2019-12-20 DIAGNOSIS — F329 Major depressive disorder, single episode, unspecified: Secondary | ICD-10-CM | POA: Diagnosis not present

## 2019-12-20 DIAGNOSIS — I509 Heart failure, unspecified: Secondary | ICD-10-CM | POA: Diagnosis not present

## 2019-12-22 DIAGNOSIS — J9610 Chronic respiratory failure, unspecified whether with hypoxia or hypercapnia: Secondary | ICD-10-CM | POA: Diagnosis not present

## 2019-12-22 DIAGNOSIS — I4891 Unspecified atrial fibrillation: Secondary | ICD-10-CM | POA: Diagnosis not present

## 2019-12-22 DIAGNOSIS — I509 Heart failure, unspecified: Secondary | ICD-10-CM | POA: Diagnosis not present

## 2019-12-29 DIAGNOSIS — J449 Chronic obstructive pulmonary disease, unspecified: Secondary | ICD-10-CM | POA: Diagnosis not present

## 2019-12-29 DIAGNOSIS — F329 Major depressive disorder, single episode, unspecified: Secondary | ICD-10-CM | POA: Diagnosis not present

## 2019-12-29 DIAGNOSIS — D649 Anemia, unspecified: Secondary | ICD-10-CM | POA: Diagnosis not present

## 2020-01-03 DIAGNOSIS — I4891 Unspecified atrial fibrillation: Secondary | ICD-10-CM | POA: Diagnosis not present

## 2020-01-03 DIAGNOSIS — J9610 Chronic respiratory failure, unspecified whether with hypoxia or hypercapnia: Secondary | ICD-10-CM | POA: Diagnosis not present

## 2020-01-03 DIAGNOSIS — I509 Heart failure, unspecified: Secondary | ICD-10-CM | POA: Diagnosis not present

## 2020-01-12 DIAGNOSIS — B351 Tinea unguium: Secondary | ICD-10-CM | POA: Diagnosis not present

## 2020-01-12 DIAGNOSIS — I70293 Other atherosclerosis of native arteries of extremities, bilateral legs: Secondary | ICD-10-CM | POA: Diagnosis not present

## 2020-01-19 DIAGNOSIS — I4891 Unspecified atrial fibrillation: Secondary | ICD-10-CM | POA: Diagnosis not present

## 2020-01-19 DIAGNOSIS — J9610 Chronic respiratory failure, unspecified whether with hypoxia or hypercapnia: Secondary | ICD-10-CM | POA: Diagnosis not present

## 2020-01-31 DIAGNOSIS — I4891 Unspecified atrial fibrillation: Secondary | ICD-10-CM | POA: Diagnosis not present

## 2020-01-31 DIAGNOSIS — D649 Anemia, unspecified: Secondary | ICD-10-CM | POA: Diagnosis not present

## 2020-01-31 DIAGNOSIS — J449 Chronic obstructive pulmonary disease, unspecified: Secondary | ICD-10-CM | POA: Diagnosis not present

## 2020-02-02 DIAGNOSIS — I4891 Unspecified atrial fibrillation: Secondary | ICD-10-CM | POA: Diagnosis not present

## 2020-02-02 DIAGNOSIS — I509 Heart failure, unspecified: Secondary | ICD-10-CM | POA: Diagnosis not present

## 2020-02-02 DIAGNOSIS — J449 Chronic obstructive pulmonary disease, unspecified: Secondary | ICD-10-CM | POA: Diagnosis not present

## 2020-02-09 DIAGNOSIS — H401133 Primary open-angle glaucoma, bilateral, severe stage: Secondary | ICD-10-CM | POA: Diagnosis not present

## 2020-02-13 DIAGNOSIS — R7989 Other specified abnormal findings of blood chemistry: Secondary | ICD-10-CM | POA: Diagnosis not present

## 2020-02-14 DIAGNOSIS — J449 Chronic obstructive pulmonary disease, unspecified: Secondary | ICD-10-CM | POA: Diagnosis not present

## 2020-02-14 DIAGNOSIS — D649 Anemia, unspecified: Secondary | ICD-10-CM | POA: Diagnosis not present

## 2020-02-14 DIAGNOSIS — I509 Heart failure, unspecified: Secondary | ICD-10-CM | POA: Diagnosis not present

## 2020-02-16 DIAGNOSIS — J449 Chronic obstructive pulmonary disease, unspecified: Secondary | ICD-10-CM | POA: Diagnosis not present

## 2020-02-16 DIAGNOSIS — D649 Anemia, unspecified: Secondary | ICD-10-CM | POA: Diagnosis not present

## 2020-02-16 DIAGNOSIS — I4891 Unspecified atrial fibrillation: Secondary | ICD-10-CM | POA: Diagnosis not present

## 2020-02-17 DIAGNOSIS — K224 Dyskinesia of esophagus: Secondary | ICD-10-CM | POA: Diagnosis not present

## 2020-02-17 DIAGNOSIS — K222 Esophageal obstruction: Secondary | ICD-10-CM | POA: Diagnosis not present

## 2020-02-17 DIAGNOSIS — J449 Chronic obstructive pulmonary disease, unspecified: Secondary | ICD-10-CM | POA: Diagnosis not present

## 2020-02-17 DIAGNOSIS — J45909 Unspecified asthma, uncomplicated: Secondary | ICD-10-CM | POA: Diagnosis not present

## 2020-02-17 DIAGNOSIS — I509 Heart failure, unspecified: Secondary | ICD-10-CM | POA: Diagnosis not present

## 2020-02-17 DIAGNOSIS — M3500 Sicca syndrome, unspecified: Secondary | ICD-10-CM | POA: Diagnosis not present

## 2020-02-17 DIAGNOSIS — W19XXXA Unspecified fall, initial encounter: Secondary | ICD-10-CM | POA: Diagnosis not present

## 2020-02-17 DIAGNOSIS — U071 COVID-19: Secondary | ICD-10-CM | POA: Diagnosis not present

## 2020-02-17 DIAGNOSIS — I4891 Unspecified atrial fibrillation: Secondary | ICD-10-CM | POA: Diagnosis not present

## 2020-02-17 DIAGNOSIS — R0781 Pleurodynia: Secondary | ICD-10-CM | POA: Diagnosis not present

## 2020-02-23 DIAGNOSIS — I509 Heart failure, unspecified: Secondary | ICD-10-CM | POA: Diagnosis not present

## 2020-02-23 DIAGNOSIS — J449 Chronic obstructive pulmonary disease, unspecified: Secondary | ICD-10-CM | POA: Diagnosis not present

## 2020-02-23 DIAGNOSIS — U071 COVID-19: Secondary | ICD-10-CM | POA: Diagnosis not present

## 2020-02-28 DIAGNOSIS — I4891 Unspecified atrial fibrillation: Secondary | ICD-10-CM | POA: Diagnosis not present

## 2020-02-28 DIAGNOSIS — I509 Heart failure, unspecified: Secondary | ICD-10-CM | POA: Diagnosis not present

## 2020-02-28 DIAGNOSIS — J449 Chronic obstructive pulmonary disease, unspecified: Secondary | ICD-10-CM | POA: Diagnosis not present
